# Patient Record
Sex: Male | Born: 1945 | Race: Black or African American | Hispanic: No | Marital: Single
Health system: Southern US, Community
[De-identification: ages and names within clinical notes are randomized; demographics above are authoritative.]

## PROBLEM LIST (undated history)

## (undated) DIAGNOSIS — E119 Type 2 diabetes mellitus without complications: Secondary | ICD-10-CM

## (undated) DIAGNOSIS — I10 Essential (primary) hypertension: Secondary | ICD-10-CM

---

## 2010-06-04 ENCOUNTER — Other Ambulatory Visit (HOSPITAL_COMMUNITY): Payer: Self-pay | Admitting: Chiropractic Medicine

## 2010-06-04 DIAGNOSIS — R29898 Other symptoms and signs involving the musculoskeletal system: Secondary | ICD-10-CM

## 2010-06-07 ENCOUNTER — Ambulatory Visit (HOSPITAL_COMMUNITY)
Admission: RE | Admit: 2010-06-07 | Discharge: 2010-06-07 | Disposition: A | Discharge status: Home / Self Care | Payer: Self-pay | Source: Ambulatory Visit | Attending: Chiropractic Medicine | Admitting: Chiropractic Medicine

## 2010-06-07 DIAGNOSIS — M545 Low back pain, unspecified: Secondary | ICD-10-CM | POA: Insufficient documentation

## 2010-06-07 DIAGNOSIS — R29898 Other symptoms and signs involving the musculoskeletal system: Secondary | ICD-10-CM

## 2010-06-07 DIAGNOSIS — M48061 Spinal stenosis, lumbar region without neurogenic claudication: Secondary | ICD-10-CM | POA: Insufficient documentation

## 2010-06-07 DIAGNOSIS — M6281 Muscle weakness (generalized): Secondary | ICD-10-CM | POA: Insufficient documentation

## 2010-06-07 DIAGNOSIS — M519 Unspecified thoracic, thoracolumbar and lumbosacral intervertebral disc disorder: Secondary | ICD-10-CM | POA: Insufficient documentation

## 2010-10-12 ENCOUNTER — Other Ambulatory Visit (HOSPITAL_COMMUNITY): Payer: Self-pay | Admitting: Chiropractic Medicine

## 2010-10-12 DIAGNOSIS — R413 Other amnesia: Secondary | ICD-10-CM

## 2010-10-13 ENCOUNTER — Ambulatory Visit (HOSPITAL_COMMUNITY)
Admission: RE | Admit: 2010-10-13 | Discharge: 2010-10-13 | Disposition: A | Discharge status: Home / Self Care | Payer: Self-pay | Source: Ambulatory Visit | Attending: Chiropractic Medicine | Admitting: Chiropractic Medicine

## 2010-10-13 DIAGNOSIS — R413 Other amnesia: Secondary | ICD-10-CM | POA: Insufficient documentation

## 2010-10-13 DIAGNOSIS — J3489 Other specified disorders of nose and nasal sinuses: Secondary | ICD-10-CM | POA: Insufficient documentation

## 2017-04-13 ENCOUNTER — Emergency Department (HOSPITAL_COMMUNITY): Payer: No Typology Code available for payment source

## 2017-04-13 ENCOUNTER — Emergency Department (HOSPITAL_COMMUNITY)
Admission: EM | Admit: 2017-04-13 | Discharge: 2017-04-13 | Disposition: A | Discharge status: Home / Self Care | Payer: No Typology Code available for payment source | Attending: Emergency Medicine | Admitting: Emergency Medicine

## 2017-04-13 ENCOUNTER — Encounter (HOSPITAL_COMMUNITY): Payer: Self-pay | Admitting: Emergency Medicine

## 2017-04-13 DIAGNOSIS — R1084 Generalized abdominal pain: Secondary | ICD-10-CM | POA: Insufficient documentation

## 2017-04-13 DIAGNOSIS — Y92410 Unspecified street and highway as the place of occurrence of the external cause: Secondary | ICD-10-CM | POA: Diagnosis not present

## 2017-04-13 DIAGNOSIS — Y9389 Activity, other specified: Secondary | ICD-10-CM | POA: Diagnosis not present

## 2017-04-13 DIAGNOSIS — M542 Cervicalgia: Secondary | ICD-10-CM | POA: Insufficient documentation

## 2017-04-13 DIAGNOSIS — S060X1A Concussion with loss of consciousness of 30 minutes or less, initial encounter: Secondary | ICD-10-CM | POA: Diagnosis not present

## 2017-04-13 DIAGNOSIS — Y999 Unspecified external cause status: Secondary | ICD-10-CM | POA: Insufficient documentation

## 2017-04-13 DIAGNOSIS — S0990XA Unspecified injury of head, initial encounter: Secondary | ICD-10-CM | POA: Diagnosis present

## 2017-04-13 LAB — URINALYSIS, ROUTINE W REFLEX MICROSCOPIC
BILIRUBIN URINE: NEGATIVE
GLUCOSE, UA: NEGATIVE mg/dL
HGB URINE DIPSTICK: NEGATIVE
KETONES UR: NEGATIVE mg/dL
Leukocytes, UA: NEGATIVE
Nitrite: NEGATIVE
PH: 6 (ref 5.0–8.0)
PROTEIN: NEGATIVE mg/dL
Specific Gravity, Urine: 1.012 (ref 1.005–1.030)

## 2017-04-13 LAB — COMPREHENSIVE METABOLIC PANEL
ALK PHOS: 45 U/L (ref 38–126)
ALT: 21 U/L (ref 17–63)
AST: 25 U/L (ref 15–41)
Albumin: 3.7 g/dL (ref 3.5–5.0)
Anion gap: 7 (ref 5–15)
BUN: 14 mg/dL (ref 6–20)
CALCIUM: 9.1 mg/dL (ref 8.9–10.3)
CHLORIDE: 107 mmol/L (ref 101–111)
CO2: 25 mmol/L (ref 22–32)
CREATININE: 1.08 mg/dL (ref 0.61–1.24)
GFR calc non Af Amer: >60 mL/min (ref >60)
GLUCOSE: 104 mg/dL — AB (ref 65–99)
Potassium: 4 mmol/L (ref 3.5–5.1)
SODIUM: 139 mmol/L (ref 135–145)
Total Bilirubin: 0.4 mg/dL (ref 0.3–1.2)
Total Protein: 6.9 g/dL (ref 6.5–8.1)

## 2017-04-13 LAB — CBC
HCT: 40.1 % (ref 39.0–52.0)
Hemoglobin: 12.7 g/dL — ABNORMAL LOW (ref 13.0–17.0)
MCH: 27.3 pg (ref 26.0–34.0)
MCHC: 31.7 g/dL (ref 30.0–36.0)
MCV: 86.1 fL (ref 78.0–100.0)
PLATELETS: 155 10*3/uL (ref 150–400)
RBC: 4.66 MIL/uL (ref 4.22–5.81)
RDW: 13.4 % (ref 11.5–15.5)
WBC: 4.2 10*3/uL (ref 4.0–10.5)

## 2017-04-13 LAB — I-STAT CG4 LACTIC ACID, ED
LACTIC ACID, VENOUS: 1.4 mmol/L (ref 0.5–1.9)
Lactic Acid, Venous: 2.45 mmol/L (ref 0.5–1.9)

## 2017-04-13 LAB — PROTIME-INR
INR: 1.03
PROTHROMBIN TIME: 13.4 s (ref 11.4–15.2)

## 2017-04-13 LAB — SAMPLE TO BLOOD BANK

## 2017-04-13 LAB — ETHANOL

## 2017-04-13 MED ORDER — FENTANYL CITRATE (PF) 100 MCG/2ML IJ SOLN
50.0000 ug | Freq: Once | INTRAMUSCULAR | Status: AC
  Administered 2017-04-13: 50 ug via INTRAVENOUS
  Filled 2017-04-13: qty 2

## 2017-04-13 MED ORDER — IOPAMIDOL (ISOVUE-300) INJECTION 61%
INTRAVENOUS | Status: AC
  Administered 2017-04-13: 100 mL
  Filled 2017-04-13: qty 100

## 2017-04-13 MED ORDER — ONDANSETRON HCL 4 MG/2ML IJ SOLN
4.0000 mg | Freq: Once | INTRAMUSCULAR | Status: AC
  Administered 2017-04-13: 4 mg via INTRAVENOUS
  Filled 2017-04-13: qty 2

## 2017-04-13 MED ORDER — CYCLOBENZAPRINE HCL 5 MG PO TABS: 5.0000 mg | ORAL_TABLET | Freq: Two times a day (BID) | ORAL | 0 refills | Status: AC | PRN

## 2017-04-13 MED ORDER — SODIUM CHLORIDE 0.9 % IV BOLUS (SEPSIS)
1000.0000 mL | Freq: Once | INTRAVENOUS | Status: AC
  Administered 2017-04-13: 1000 mL via INTRAVENOUS

## 2017-04-13 NOTE — ED Notes (Signed)
Pt verbalized understanding discharge instructions and denies any further needs or questions at this time. VS stable, ambulatory and steady gait.   
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
## 2017-04-13 NOTE — ED Notes (Signed)
Patient is resting
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
## 2017-04-13 NOTE — Discharge Instructions (Signed)
Your imaging revealed no evidence of significant traumatic injury.  We suspect he had a concussion.  He will likely feel sore for the next few days, please use the muscle relaxant to help if you are having muscle spasms continuing.  Please follow-up with a primary care physician for reassessment and further management.  If any symptoms change or worsen, please return to the nearest emergency department.

## 2017-04-23 ENCOUNTER — Encounter (HOSPITAL_COMMUNITY): Payer: Self-pay | Admitting: *Deleted

## 2017-04-23 ENCOUNTER — Emergency Department (HOSPITAL_COMMUNITY)
Admission: EM | Admit: 2017-04-23 | Discharge: 2017-04-23 | Disposition: A | Discharge status: Home / Self Care | Payer: No Typology Code available for payment source | Attending: Emergency Medicine | Admitting: Emergency Medicine

## 2017-04-23 ENCOUNTER — Other Ambulatory Visit: Payer: Self-pay

## 2017-04-23 ENCOUNTER — Emergency Department (HOSPITAL_COMMUNITY): Payer: No Typology Code available for payment source

## 2017-04-23 DIAGNOSIS — F0781 Postconcussional syndrome: Secondary | ICD-10-CM

## 2017-04-23 DIAGNOSIS — M791 Myalgia, unspecified site: Secondary | ICD-10-CM | POA: Diagnosis not present

## 2017-04-23 DIAGNOSIS — R109 Unspecified abdominal pain: Secondary | ICD-10-CM | POA: Diagnosis present

## 2017-04-23 LAB — COMPREHENSIVE METABOLIC PANEL
ALBUMIN: 3.8 g/dL (ref 3.5–5.0)
ALK PHOS: 52 U/L (ref 38–126)
ALT: 23 U/L (ref 17–63)
AST: 23 U/L (ref 15–41)
Anion gap: 6 (ref 5–15)
BILIRUBIN TOTAL: 0.5 mg/dL (ref 0.3–1.2)
BUN: 16 mg/dL (ref 6–20)
CALCIUM: 9.1 mg/dL (ref 8.9–10.3)
CO2: 27 mmol/L (ref 22–32)
Chloride: 103 mmol/L (ref 101–111)
Creatinine, Ser: 1.13 mg/dL (ref 0.61–1.24)
GFR calc Af Amer: >60 mL/min (ref >60)
GFR calc non Af Amer: >60 mL/min (ref >60)
GLUCOSE: 93 mg/dL (ref 65–99)
Potassium: 4.2 mmol/L (ref 3.5–5.1)
SODIUM: 136 mmol/L (ref 135–145)
TOTAL PROTEIN: 7.4 g/dL (ref 6.5–8.1)

## 2017-04-23 LAB — CBC
HEMATOCRIT: 40.7 % (ref 39.0–52.0)
HEMOGLOBIN: 13.2 g/dL (ref 13.0–17.0)
MCH: 27.4 pg (ref 26.0–34.0)
MCHC: 32.4 g/dL (ref 30.0–36.0)
MCV: 84.6 fL (ref 78.0–100.0)
Platelets: 188 10*3/uL (ref 150–400)
RBC: 4.81 MIL/uL (ref 4.22–5.81)
RDW: 13 % (ref 11.5–15.5)
WBC: 5.2 10*3/uL (ref 4.0–10.5)

## 2017-04-23 LAB — URINALYSIS, ROUTINE W REFLEX MICROSCOPIC
BILIRUBIN URINE: NEGATIVE
Glucose, UA: NEGATIVE mg/dL
HGB URINE DIPSTICK: NEGATIVE
Ketones, ur: NEGATIVE mg/dL
Leukocytes, UA: NEGATIVE
Nitrite: NEGATIVE
PH: 6 (ref 5.0–8.0)
Protein, ur: NEGATIVE mg/dL
SPECIFIC GRAVITY, URINE: 1.01 (ref 1.005–1.030)

## 2017-04-23 LAB — LIPASE, BLOOD: Lipase: 20 U/L (ref 11–51)

## 2017-04-23 MED ORDER — CYCLOBENZAPRINE HCL 5 MG PO TABS: 5.0000 mg | ORAL_TABLET | Freq: Every evening | ORAL | 0 refills | Status: AC | PRN

## 2017-04-23 MED ORDER — IBUPROFEN 600 MG PO TABS: 600.0000 mg | ORAL_TABLET | Freq: Two times a day (BID) | ORAL | 0 refills | Status: AC

## 2017-04-23 MED ORDER — KETOROLAC TROMETHAMINE 15 MG/ML IJ SOLN
15.0000 mg | Freq: Once | INTRAMUSCULAR | Status: AC
  Administered 2017-04-23: 15 mg via INTRAVENOUS
  Filled 2017-04-23: qty 1

## 2017-04-23 NOTE — ED Triage Notes (Addendum)
Pt reports having a recent cough, left rib pain. Has headache and neck pain. Denies fever. Unable to sleep. Is tearful at triage. Has other complaints such as n/v/d and recent blood in stools.

## 2017-04-23 NOTE — Discharge Instructions (Signed)
Take ibuprofen 2 times a day with meals.  Do not take other anti-inflammatories at the same time open (Advil, Motrin, naproxen, Aleve). You may supplement with Tylenol if you need further pain control. Use ice packs or heating pads to help control your pain. Use Flexeril as needed for bedtime use.  Have caution, as this may make you tired or groggy.  Do not drive or operate heavy machinery while taking this medicine. Follow up with your primary care doctor for evaluation of your blood pressure and pain.
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
## 2020-08-14 ENCOUNTER — Emergency Department (HOSPITAL_COMMUNITY): Payer: No Typology Code available for payment source

## 2020-08-14 ENCOUNTER — Inpatient Hospital Stay (HOSPITAL_COMMUNITY)
Admission: EM | Admit: 2020-08-14 | Discharge: 2020-08-15 | DRG: 069 | Disposition: A | Discharge status: Home / Self Care | Payer: No Typology Code available for payment source | Attending: Internal Medicine | Admitting: Internal Medicine

## 2020-08-14 DIAGNOSIS — M549 Dorsalgia, unspecified: Secondary | ICD-10-CM | POA: Diagnosis present

## 2020-08-14 DIAGNOSIS — I10 Essential (primary) hypertension: Secondary | ICD-10-CM | POA: Diagnosis present

## 2020-08-14 DIAGNOSIS — Z7982 Long term (current) use of aspirin: Secondary | ICD-10-CM

## 2020-08-14 DIAGNOSIS — F0280 Dementia in other diseases classified elsewhere without behavioral disturbance: Secondary | ICD-10-CM | POA: Diagnosis present

## 2020-08-14 DIAGNOSIS — Z79899 Other long term (current) drug therapy: Secondary | ICD-10-CM

## 2020-08-14 DIAGNOSIS — E785 Hyperlipidemia, unspecified: Secondary | ICD-10-CM | POA: Diagnosis present

## 2020-08-14 DIAGNOSIS — C61 Malignant neoplasm of prostate: Secondary | ICD-10-CM | POA: Insufficient documentation

## 2020-08-14 DIAGNOSIS — R42 Dizziness and giddiness: Secondary | ICD-10-CM

## 2020-08-14 DIAGNOSIS — R471 Dysarthria and anarthria: Secondary | ICD-10-CM | POA: Diagnosis present

## 2020-08-14 DIAGNOSIS — F4312 Post-traumatic stress disorder, chronic: Secondary | ICD-10-CM | POA: Insufficient documentation

## 2020-08-14 DIAGNOSIS — G629 Polyneuropathy, unspecified: Secondary | ICD-10-CM | POA: Diagnosis present

## 2020-08-14 DIAGNOSIS — K219 Gastro-esophageal reflux disease without esophagitis: Secondary | ICD-10-CM | POA: Diagnosis present

## 2020-08-14 DIAGNOSIS — Z8673 Personal history of transient ischemic attack (TIA), and cerebral infarction without residual deficits: Secondary | ICD-10-CM | POA: Diagnosis not present

## 2020-08-14 DIAGNOSIS — F4310 Post-traumatic stress disorder, unspecified: Secondary | ICD-10-CM | POA: Diagnosis present

## 2020-08-14 DIAGNOSIS — R7303 Prediabetes: Secondary | ICD-10-CM | POA: Diagnosis present

## 2020-08-14 DIAGNOSIS — Z8546 Personal history of malignant neoplasm of prostate: Secondary | ICD-10-CM | POA: Diagnosis not present

## 2020-08-14 DIAGNOSIS — J302 Other seasonal allergic rhinitis: Secondary | ICD-10-CM | POA: Diagnosis present

## 2020-08-14 DIAGNOSIS — R001 Bradycardia, unspecified: Secondary | ICD-10-CM | POA: Diagnosis present

## 2020-08-14 DIAGNOSIS — G8929 Other chronic pain: Secondary | ICD-10-CM | POA: Diagnosis present

## 2020-08-14 DIAGNOSIS — G459 Transient cerebral ischemic attack, unspecified: Principal | ICD-10-CM | POA: Diagnosis present

## 2020-08-14 DIAGNOSIS — N400 Enlarged prostate without lower urinary tract symptoms: Secondary | ICD-10-CM | POA: Diagnosis present

## 2020-08-14 DIAGNOSIS — Z20822 Contact with and (suspected) exposure to covid-19: Secondary | ICD-10-CM | POA: Diagnosis present

## 2020-08-14 DIAGNOSIS — F329 Major depressive disorder, single episode, unspecified: Secondary | ICD-10-CM | POA: Diagnosis present

## 2020-08-14 LAB — COMPREHENSIVE METABOLIC PANEL
ALT: 24 U/L (ref 0–44)
AST: 24 U/L (ref 15–41)
Albumin: 4 g/dL (ref 3.5–5.0)
Alkaline Phosphatase: 45 U/L (ref 38–126)
Anion gap: 5 (ref 5–15)
BUN: 15 mg/dL (ref 8–23)
CO2: 29 mmol/L (ref 22–32)
Calcium: 9.2 mg/dL (ref 8.9–10.3)
Chloride: 104 mmol/L (ref 98–111)
Creatinine, Ser: 1.19 mg/dL (ref 0.61–1.24)
GFR, Estimated: >60 mL/min (ref >60)
Glucose, Bld: 128 mg/dL — ABNORMAL HIGH (ref 70–99)
Potassium: 4.4 mmol/L (ref 3.5–5.1)
Sodium: 138 mmol/L (ref 135–145)
Total Bilirubin: 0.9 mg/dL (ref 0.3–1.2)
Total Protein: 7.6 g/dL (ref 6.5–8.1)

## 2020-08-14 LAB — DIFFERENTIAL
Abs Immature Granulocytes: 0.03 10*3/uL (ref 0.00–0.07)
Basophils Absolute: 0 10*3/uL (ref 0.0–0.1)
Basophils Relative: 0 %
Eosinophils Absolute: 0.1 10*3/uL (ref 0.0–0.5)
Eosinophils Relative: 1 %
Immature Granulocytes: 0 %
Lymphocytes Relative: 14 %
Lymphs Abs: 1.1 10*3/uL (ref 0.7–4.0)
Monocytes Absolute: 0.4 10*3/uL (ref 0.1–1.0)
Monocytes Relative: 5 %
Neutro Abs: 6.3 10*3/uL (ref 1.7–7.7)
Neutrophils Relative %: 80 %

## 2020-08-14 LAB — CBC
HCT: 45.2 % (ref 39.0–52.0)
Hemoglobin: 14.3 g/dL (ref 13.0–17.0)
MCH: 27.6 pg (ref 26.0–34.0)
MCHC: 31.6 g/dL (ref 30.0–36.0)
MCV: 87.1 fL (ref 80.0–100.0)
Platelets: 189 10*3/uL (ref 150–400)
RBC: 5.19 MIL/uL (ref 4.22–5.81)
RDW: 12.9 % (ref 11.5–15.5)
WBC: 7.9 10*3/uL (ref 4.0–10.5)
nRBC: 0 % (ref 0.0–0.2)

## 2020-08-14 LAB — PROTIME-INR
INR: 1.1 (ref 0.8–1.2)
Prothrombin Time: 13.7 seconds (ref 11.4–15.2)

## 2020-08-14 LAB — CBG MONITORING, ED
Glucose-Capillary: 96 mg/dL (ref 70–99)
Glucose-Capillary: 104 mg/dL — ABNORMAL HIGH (ref 70–99)

## 2020-08-14 LAB — RESP PANEL BY RT-PCR (FLU A&B, COVID) ARPGX2
Influenza A by PCR: NEGATIVE
Influenza B by PCR: NEGATIVE
SARS Coronavirus 2 by RT PCR: NEGATIVE

## 2020-08-14 LAB — I-STAT CHEM 8, ED
BUN: 18 mg/dL (ref 8–23)
Calcium, Ion: 1.23 mmol/L (ref 1.15–1.40)
Chloride: 102 mmol/L (ref 98–111)
Creatinine, Ser: 1.2 mg/dL (ref 0.61–1.24)
Glucose, Bld: 121 mg/dL — ABNORMAL HIGH (ref 70–99)
HCT: 46 % (ref 39.0–52.0)
Hemoglobin: 15.6 g/dL (ref 13.0–17.0)
Potassium: 4.7 mmol/L (ref 3.5–5.1)
Sodium: 141 mmol/L (ref 135–145)
TCO2: 29 mmol/L (ref 22–32)

## 2020-08-14 LAB — APTT: aPTT: 27 seconds (ref 24–36)

## 2020-08-14 LAB — ETHANOL: Alcohol, Ethyl (B): <10 mg/dL (ref <10)

## 2020-08-14 LAB — VITAMIN B12: Vitamin B-12: 201 pg/mL (ref 180–914)

## 2020-08-14 LAB — LACTIC ACID, PLASMA: Lactic Acid, Venous: 1.9 mmol/L (ref 0.5–1.9)

## 2020-08-14 LAB — TSH: TSH: 0.894 u[IU]/mL (ref 0.350–4.500)

## 2020-08-14 MED ORDER — CLOPIDOGREL BISULFATE 75 MG PO TABS
75.0000 mg | ORAL_TABLET | Freq: Every day | ORAL | Status: DC
  Administered 2020-08-14 – 2020-08-15 (×2): 75 mg via ORAL
  Filled 2020-08-14 (×2): qty 1

## 2020-08-14 MED ORDER — CITALOPRAM HYDROBROMIDE 10 MG PO TABS
20.0000 mg | ORAL_TABLET | Freq: Every day | ORAL | Status: DC
  Administered 2020-08-15: 20 mg via ORAL
  Filled 2020-08-14 (×2): qty 2

## 2020-08-14 MED ORDER — ACETAMINOPHEN 650 MG RE SUPP: 650.0000 mg | Freq: Four times a day (QID) | RECTAL | Status: DC | PRN

## 2020-08-14 MED ORDER — GABAPENTIN 300 MG PO CAPS
300.0000 mg | ORAL_CAPSULE | Freq: Every morning | ORAL | Status: DC
  Administered 2020-08-15: 300 mg via ORAL
  Filled 2020-08-14: qty 1

## 2020-08-14 MED ORDER — DIVALPROEX SODIUM 250 MG PO DR TAB
1000.0000 mg | DELAYED_RELEASE_TABLET | Freq: Every day | ORAL | Status: DC
  Administered 2020-08-15: 1000 mg via ORAL
  Filled 2020-08-14: qty 4

## 2020-08-14 MED ORDER — TRAZODONE HCL 50 MG PO TABS: 25.0000 mg | ORAL_TABLET | Freq: Every evening | ORAL | Status: DC | PRN

## 2020-08-14 MED ORDER — HYDROCODONE-ACETAMINOPHEN 5-325 MG PO TABS: 1.0000 | ORAL_TABLET | Freq: Once | ORAL | Status: DC | PRN

## 2020-08-14 MED ORDER — FLUTICASONE PROPIONATE 50 MCG/ACT NA SUSP
1.0000 | Freq: Every day | NASAL | Status: DC
  Filled 2020-08-14: qty 16

## 2020-08-14 MED ORDER — POLYETHYLENE GLYCOL 3350 17 G PO PACK: 17.0000 g | PACK | Freq: Every day | ORAL | Status: DC | PRN

## 2020-08-14 MED ORDER — ASPIRIN EC 81 MG PO TBEC
81.0000 mg | DELAYED_RELEASE_TABLET | Freq: Every day | ORAL | Status: DC
  Administered 2020-08-14 – 2020-08-15 (×2): 81 mg via ORAL
  Filled 2020-08-14 (×2): qty 1

## 2020-08-14 MED ORDER — SIMVASTATIN 20 MG PO TABS: 40.0000 mg | ORAL_TABLET | Freq: Every day | ORAL | Status: DC

## 2020-08-14 MED ORDER — ENOXAPARIN SODIUM 40 MG/0.4ML ~~LOC~~ SOLN
40.0000 mg | SUBCUTANEOUS | Status: DC
  Administered 2020-08-14: 40 mg via SUBCUTANEOUS
  Filled 2020-08-14: qty 0.4

## 2020-08-14 MED ORDER — GABAPENTIN 600 MG PO TABS
600.0000 mg | ORAL_TABLET | Freq: Every day | ORAL | Status: DC
  Administered 2020-08-14: 600 mg via ORAL
  Filled 2020-08-14: qty 1

## 2020-08-14 MED ORDER — IOHEXOL 350 MG/ML SOLN
75.0000 mL | Freq: Once | INTRAVENOUS | Status: AC | PRN
  Administered 2020-08-14: 75 mL via INTRAVENOUS

## 2020-08-14 MED ORDER — ACETAMINOPHEN 325 MG PO TABS: 650.0000 mg | ORAL_TABLET | Freq: Four times a day (QID) | ORAL | Status: DC | PRN

## 2020-08-14 NOTE — H&P (Signed)
Date: 08/14/2020               Patient Name:  Jacob [name] MRN: [id number]  DOB: Dec 07, 1945 Age / Sex: 75 y.o., male   [facility]         Medical Service: Internal Medicine Teaching Service         Attending Physician: Dr. Dorian Pod    First Contact: Dr. Alexandria Lodge Pager: [phone]  Second Contact: Dr. Mitzi Hansen Pager: [phone]       After Hours (After 5p/  First Contact Pager: [phone]  weekends / holidays): Second Contact Pager: [phone]   Chief Complaint: dizziness  History of Present Illness:   Jacob [name] is a 75 y.o. man with past medical history of HTN, HLD, pre-diabetes, BPH, prostate cancer, GERD, degenerative disc disease, spinal stenosis, chronic back pain, peripheral neuropathy, history of exposure to Agent Orange, PTSD, major depression, possible dementia (patient on Aricept on [place] record) presenting to the [facility] ED with acute onset dizziness and treated as a code stroke on arrival.  Patient reports he was in his usual state of health until around 12pm today when he was in [place] sitting in a car trying to get it to start and experienced acute onset dizziness. States he felt as if his head tipped back as the feeling came on. He told his friend who he was with that he didn't feel right, and when the friend told him to get into his car, he reports he was so dizzy he had to crawl his way into it. They stopped for food on the way and he vomited when he tried to eat. By the time he arrived to the ED was feeling significantly less dizzy.  Patient reports he has experienced intermittent slight dizziness "for a while," "but nothing like today." States the dizziness he experiences does not occur with position changes. He "takes his time" and the episodes resolve. States he wears glasses on-and-off and two months ago noticed his vision became dim, however last week everything became brighter again. Denies headache, blurry vision, congestion,  sore throat, chest pain, shortness of breath, palpitations, abdominal pain, dysuria, hematuria, weakness, numbness, tingling.  Reports he gets most of his medical care from [facility]. He is unable to list most of his medical problems or medications, however reports adherence to his medications.    ED Course: Afebrile, BP 160s/70s-80s, P 50s-60s, SpO2 >94% on room air. CBC with WBC 7.9, Hgb 14.3, Plts 189. CMP with Na 138, K 4.4, glucose 128, creatinine 1.19 (baseline ~1.1). TSH 0.894. Vit B12 201. Ethanol <10. PT/INR 13.7/1.1. CT head wo contrast without evidence of acute intracranial abnormality. CT angio neck code stroke significant for patent common carotid and internal carotid arteries with mild atherosclerotic disease, suspected moderate/severe stenosis at the origin of the R vertebral artery, nonspecific 16 mm cystic appearing cutaneous/subQ lesion within the posterior left upper neck, markedly irregular and stenotic appearance of the V4 vertebral arteries bilaterally and of the proximal basilar artery with possible partial occlusion of these vessels. EKG limited quality, bradycardia. IMTS consulted for admission for TIA workup.   Meds:   [facility]: Aspirin 81 mg daily Citalopram 40 mg daily Trazodone 25 mg nightly as needed Divalproex 1000 mg once daily Donepezil 23 mg daily Flonase Gabapentin 300 mg AM, 600 mg PM Hydrocodone-acetaminophen 5-325 mg once daily PRN Lisinopril 5 mg daily Prilosec Simvastatin 40 mg daily Terazosin 2 mg nightly  Social History:  Lives in [place], [place]. 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 Denies current or previous cigarette smoking, alcohol use, marijuana use, or other drugs. Vaccinated against COVID x 3.  Family History: Denies family history of stroke, heart attack, cancer, diabetes, hypertension. Mother and father died from old age.   Allergies: Allergies as of 08/14/2020  . (No Known Allergies)    Review of  Systems: A complete ROS was negative except as per HPI.   Physical Exam: Blood pressure (!) 167/80, pulse (!) 50, resp. rate 18, SpO2 100 %. Constitutional: well-appearing man lying in ED stretcher, in no acute distress HENT: normocephalic atraumatic, mucous membranes moist Eyes: conjunctiva non-erythematous Neck: supple Cardiovascular: bradycardia, regular rhythm, no m/r/g, trace bilateral lower extremity edema to the mid shins; 1+ DP pulses Pulmonary/Chest: normal work of breathing on room air, lungs clear to auscultation bilaterally, no wheezes, rales, or rhonchi Abdominal: soft, non-tender, non-distended MSK: normal bulk and tone Neurological: alert & oriented to person, place, and time; CN II-XII intact, 5/5 strength in bilateral upper and lower extremities, sensation intact to light touch throughout. No dysmetria with finger-to-nose or heel-to-shin. No pronator drift. Skin: warm and dry; numerous scars across chest which patient states are from [place] Psych: normal mood and affect  Labs: CBC    Component Value Date/Time   WBC 7.9 08/14/2020 1521   RBC 5.19 08/14/2020 1521   HGB 15.6 08/14/2020 1534   HCT 46.0 08/14/2020 1534   PLT 189 08/14/2020 1521   MCV 87.1 08/14/2020 1521   MCH 27.6 08/14/2020 1521   MCHC 31.6 08/14/2020 1521   RDW 12.9 08/14/2020 1521   LYMPHSABS 1.1 08/14/2020 1521   MONOABS 0.4 08/14/2020 1521   EOSABS 0.1 08/14/2020 1521   BASOSABS 0.0 08/14/2020 1521     CMP     Component Value Date/Time   NA 141 08/14/2020 1534   K 4.7 08/14/2020 1534   CL 102 08/14/2020 1534   CO2 29 08/14/2020 1521   GLUCOSE 121 (H) 08/14/2020 1534   BUN 18 08/14/2020 1534   CREATININE 1.20 08/14/2020 1534   CALCIUM 9.2 08/14/2020 1521   PROT 7.6 08/14/2020 1521   ALBUMIN 4.0 08/14/2020 1521   AST 24 08/14/2020 1521   ALT 24 08/14/2020 1521   ALKPHOS 45 08/14/2020 1521   BILITOT 0.9 08/14/2020 1521   GFRNONAA >60 08/14/2020 1521   GFRAA >60 04/23/2017 1023     Imaging: CT HEAD CODE STROKE WO CONTRAST  Result Date: 08/14/2020 CLINICAL DATA:  Code stroke. Neuro deficit, acute, stroke suspected; dizziness, visual disturbance. Additional provided vision, weakness. EXAM: CT HEAD WITHOUT CONTRAST TECHNIQUE: Contiguous axial images were obtained from the base of the skull through the vertex without intravenous contrast. COMPARISON:  Noncontrast head CT 04/13/2017.  Brain MRI 10/13/2010. FINDINGS: Brain: Cerebral volume is normal. There is no acute intracranial hemorrhage. No demarcated cortical infarct. No extra-axial fluid collection. No evidence of intracranial mass. No midline shift. Vascular: No hyperdense vessel.  Atherosclerotic calcifications. Skull: Normal. Negative for fracture or focal lesion. Sinuses/Orbits: Visualized orbits show no acute finding. Small bilateral maxillary sinus mucous retention cysts at the imaged levels. ASPECTS [facility] Stroke Program Early CT Score) - Ganglionic level infarction (caudate, lentiform nuclei, internal capsule, insula, M1-M3 cortex): 7 - Supraganglionic infarction (M4-M6 cortex): 3 Total score (0-10 with 10 being normal): 10 These results were communicated to Dr. Leonel Ramsay at 3:44 pmon 4/15/2022by text page via the [facility] messaging system. IMPRESSION: No evidence of acute intracranial  abnormality. ASPECTS is 10. Electronically Signed   By: Kellie Simmering DO   On: 08/14/2020 15:44    EKG: personally reviewed. My interpretation is bradycardia, limited quality.  Assessment & Plan by Problem: Principal Problem:   Transient ischemic attack (TIA) Active Problems:   Essential hypertension   Hyperlipidemia LDL goal <70  Jacob [name] is a 75 y.o. man with past medical history of HTN, HLD, pre-diabetes, BPH, prostate cancer, GERD, degenerative disc disease, spinal stenosis, chronic back pain, peripheral neuropathy, history of exposure to Agent Orange, PTSD, major depression, possible dementia (patient on Aricept on [place]  record) presenting to the [facility] ED with acute onset dizziness, treated as a code stroke on arrival, and admitted for TIA work-up and risk factor modification.  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 CTH negative for acute infarct. CTA head and neck with moderate to severe stenosis of R vertebral artery and irregular and stenotic appearance of V4 bilaterally. Additionally stenosis to the proximal basilar artery with possible partial occlusion. No tPA was given because of mild symptoms which had resolved and NIHSS of zero. On my evaluation, vitals significant for BP 160s/80s, P 50s-60s. Patient endorsing mild residual dizziness but no focal deficits on exam. Neurology to obtain MRI brain and MRA head and neck given the streak artifact and uncertain degree of basilar stenosis vs partial occlusion. Given the acute, spontaneous, and persistent nature of the patient's dizziness, differential includes vestibular neuronitis however would not expect such quick resolution of symptoms. ABCD2 score of 4 (age >60, BP >140/90, duration >60 min). Risk factors include HTN, HLD, pre-diabetes. TIA work-up and risk factor modification as below.  - Neurology stroke team following, appreciate their expertise - MRI brain without contrast - MRA head and neck after MRI if needed - TTE - Hgb A1c, lipid panel, TSH - Urinalysis, urine culture,  - Patient on simvastatin 40 mg daily; recommend increased dose if LDL >70 - Continue aspirin 81 mg daily for life - Start clopidogrel 75 mg daily for life - Permissive hypertension for first 24 h <220/110. Hold home lisinopril 5 mg daily. - Cardiac telemetry - NPO until passes bedside swallow - PT/OT/SLP consult  PTSD Depression ?Dementia or MCI -  continue home citalopram 20 mg daily - continue home trazodone 25 mg nightly as needed - continue home Divalproex 1000 mg once daily - Donepezil 23 mg daily is on the patient's med list, however does not have a fill history so will hold for now  BPH History of prostate cancer - Hold terazosin 2 mg nightly in setting of permissive hypertension  Chronic back pain Peripheral neuropathy On chart review, patient with history of DDD, spinal stenosis. - Continue home hydrocodone-acetaminophen 5-325 mg once daily PRN - Continue home Gabapentin 300 mg AM, 600 mg PM  Seasonal allergies Continue home flonase   Diet: Heart Healthy once passes swallow screen VTE: Enoxaparin IVF: None Code: Full  Prior to Admission Living Arrangement: Home Anticipated Discharge Location: Home Barriers to Discharge: further work-up and evaluation  Dispo: Admit patient to Inpatient with expected length of stay greater than 2 midnights.  Signed: Alexandria Lodge, MD Internal Medicine Resident, PGY-1 [facility] Pager: [phone] 4:28 PM, 08/14/2020

## 2020-08-14 NOTE — ED Notes (Signed)
Returned from MRI 

## 2020-08-14 NOTE — ED Provider Notes (Signed)
Patient placed in Quick Look pathway, seen and evaluated   Chief Complaint: dizzy, blurred vision, balance off  HPI:   Pt reports he  Feels like he is drunk. (pt reports he does not drink) Pt reports vision is blurry.  Pt reports he is having difficulty walking.  Pt reports symptoms began today.  Pt reports he felt bad yesterday as well   ROS: no fever, no chills no cough  Physical Exam:   Gen: No distress  Neuro: Awake and Alert  Skin: Warm    Focused Exam: wdwn . Lungs normal resp, Heart bradycardic at 50   Neuro alert, no facial droop   Initiation of care has begun. The patient has been counseled on the process, plan, and necessity for staying for the completion/evaluation, and the remainder of the medical screening examination  Pt within stoke window.     Sidney Ace 08/14/20 1526    Arnaldo Natal, MD 08/14/20 [phone]

## 2020-08-14 NOTE — Code Documentation (Signed)
Stroke Response Nurse Documentation Code Documentation  Jacob [name] is a 75 y.o. male arriving to [place] ED via Private Vehicle on 08/14/2020 with past medical hx of HTN, Prediabetes, and unknown per patient. Code stroke was activated by ED. Patient from home where he was LKW [place] 1200 [place]. He had a sudden onset of dizziness and trouble walking. The friend working with him brought him to the ED.   Stroke team at the bedside on patient activation. Labs drawn and patient cleared for CT by [facility]. Patient to CT with team. NIHSS 0, see documentation for details and code stroke times. Reports dizziness is getting better and is only mildly still present. The following imaging was completed:  CT and CTA Head and Neck. Patient is not a candidate for tPA due to too mild to treat. Care/Plan: q30 mNIHSS/VS until 1630. q2 mNIHSS/VS after this. Bedside handoff with ED RN Luiz Iron.    Patient was walked in the room and report gait was much better than noon. HE was steady on his feet for most of the walk and had slight wobble upon sitting down  Mohammed Kindle S  Stroke Response RN

## 2020-08-14 NOTE — ED Provider Notes (Signed)
[facility] EMERGENCY DEPARTMENT Provider Note   CSN: [id number] Arrival date & time: 08/14/20  1350  An emergency department physician performed an initial assessment on this suspected stroke patient at 1522.  History Chief Complaint  Patient presents with  . Dizziness   HPI Patient is a 75 year old male with a relatively minimal past medical history coming in with a chief complaint of lightheadedness, dizziness earlier today.  Patient is mostly treated at the [facility] for PTSD but is otherwise has minimal medical history.  He does endorse a distant history of atrial fibrillation and states that he is on a blood thinner but does not know which one.  Patient states that 3 hours prior to arrival he had sudden onset lightheadedness and dizziness that was nonpositional in nature.  This described spontaneous episodic vertigo. Patient denies fevers or chills, nausea vomiting, syncope or shortness of breath.  Patient otherwise healthy and active.  No past medical history on file.  There are no problems to display for this patient.   No past surgical history on file.     No family history on file.  Social History   Tobacco Use  . Smoking status: Unknown If Ever Smoked  Substance Use Topics  . Alcohol use: No  . Drug use: No    Home Medications Prior to Admission medications   Medication Sig Start Date End Date Taking? Authorizing Provider  cyclobenzaprine (FLEXERIL) 5 MG tablet Take 1 tablet (5 mg total) by mouth 2 (two) times daily as needed for muscle spasms. 04/13/17   Tegeler, Gwenyth Allegra, MD  cyclobenzaprine (FLEXERIL) 5 MG tablet Take 1 tablet (5 mg total) by mouth at bedtime as needed for muscle spasms. 04/23/17   Caccavale, Sophia, PA-C  ibuprofen (ADVIL,MOTRIN) 600 MG tablet Take 1 tablet (600 mg total) by mouth 2 (two) times daily with a meal. 04/23/17   Caccavale, Sophia, PA-C    Allergies    Patient has no known allergies.  Review of Systems    Review of Systems  Constitutional: Negative for chills and fever.  HENT: Negative for ear pain and sore throat.   Eyes: Negative for pain and visual disturbance.  Respiratory: Negative for cough and shortness of breath.   Cardiovascular: Negative for chest pain and palpitations.  Gastrointestinal: Negative for abdominal pain and vomiting.  Genitourinary: Negative for dysuria and hematuria.  Musculoskeletal: Negative for arthralgias and back pain.  Skin: Negative for color change and rash.  Neurological: Positive for dizziness and light-headedness. Negative for seizures and syncope.  All other systems reviewed and are negative.   Physical Exam Updated Vital Signs BP (!) 167/80 (BP Location: Right Leg)   Pulse (!) 50   Resp 18   SpO2 100%   Physical Exam Vitals and nursing note reviewed.  Constitutional:      Appearance: He is well-developed.  HENT:     Head: Normocephalic and atraumatic.     Nose: No congestion or rhinorrhea.     Mouth/Throat:     Mouth: Mucous membranes are moist.     Pharynx: Oropharynx is clear. No oropharyngeal exudate.  Eyes:     Conjunctiva/sclera: Conjunctivae normal.     Pupils: Pupils are equal, round, and reactive to light.  Cardiovascular:     Rate and Rhythm: Normal rate and regular rhythm.     Heart sounds: No murmur heard.   Pulmonary:     Effort: Pulmonary effort is normal. No respiratory distress.     Breath sounds: Normal  breath sounds.  Abdominal:     Palpations: Abdomen is soft.     Tenderness: There is no abdominal tenderness.  Musculoskeletal:        General: No swelling, tenderness, deformity or signs of injury. Normal range of motion.     Cervical back: Neck supple. No rigidity or tenderness.  Skin:    General: Skin is warm and dry.  Neurological:     General: No focal deficit present.     Mental Status: He is alert and oriented to person, place, and time. Mental status is at baseline.     Cranial Nerves: No cranial nerve  deficit.     Motor: No weakness.     ED Results / Procedures / Treatments   Labs (all labs ordered are listed, but only abnormal results are displayed) Labs Reviewed  COMPREHENSIVE METABOLIC PANEL - Abnormal; Notable for the following components:      Result Value   Glucose, Bld 128 (*)    All other components within normal limits  I-STAT CHEM 8, ED - Abnormal; Notable for the following components:   Glucose, Bld 121 (*)    All other components within normal limits  CBG MONITORING, ED - Abnormal; Notable for the following components:   Glucose-Capillary 104 (*)    All other components within normal limits  RESP PANEL BY RT-PCR (FLU A&B, COVID) ARPGX2  SARS CORONAVIRUS 2 (TAT 6-24 HRS)  ETHANOL  PROTIME-INR  APTT  CBC  DIFFERENTIAL  RAPID URINE DRUG SCREEN, HOSP PERFORMED  URINALYSIS, ROUTINE W REFLEX MICROSCOPIC  TSH  VITAMIN B12  HEMOGLOBIN A1C  CBG MONITORING, ED    EKG None  Radiology CT HEAD CODE STROKE WO CONTRAST  Result Date: 08/14/2020 CLINICAL DATA:  Code stroke. Neuro deficit, acute, stroke suspected; dizziness, visual disturbance. Additional provided vision, weakness. EXAM: CT HEAD WITHOUT CONTRAST TECHNIQUE: Contiguous axial images were obtained from the base of the skull through the vertex without intravenous contrast. COMPARISON:  Noncontrast head CT 04/13/2017.  Brain MRI 10/13/2010. FINDINGS: Brain: Cerebral volume is normal. There is no acute intracranial hemorrhage. No demarcated cortical infarct. No extra-axial fluid collection. No evidence of intracranial mass. No midline shift. Vascular: No hyperdense vessel.  Atherosclerotic calcifications. Skull: Normal. Negative for fracture or focal lesion. Sinuses/Orbits: Visualized orbits show no acute finding. Small bilateral maxillary sinus mucous retention cysts at the imaged levels. ASPECTS [facility] Stroke Program Early CT Score) - Ganglionic level infarction (caudate, lentiform nuclei, internal capsule, insula,  M1-M3 cortex): 7 - Supraganglionic infarction (M4-M6 cortex): 3 Total score (0-10 with 10 being normal): 10 These results were communicated to Dr. Leonel Ramsay at 3:44 pmon 4/15/2022by text page via the [facility] messaging system. IMPRESSION: No evidence of acute intracranial abnormality. ASPECTS is 10. Electronically Signed   By: Kellie Simmering DO   On: 08/14/2020 15:44    Procedures Procedures   Medications Ordered in ED Medications  iohexol (OMNIPAQUE) 350 MG/ML injection 75 mL (75 mLs Intravenous Contrast Given 08/14/20 1617)    ED Course  I have reviewed the triage vital signs and the nursing notes.  Pertinent labs & imaging results that were available during my care of the patient were reviewed by me and considered in my medical decision making (see chart for details).    MDM Rules/Calculators/A&P                          Patient is a 75 year old male presenting with 3 hours of dizziness now  resolving.  Patient treated as a code stroke on arrival to the emergency department given the sudden onset of symptoms.  Patient describing no triggers leading to a past classification of spontaneous episodic vertigo.  This would be most consistent with possible CVA episode.  Neurology saw patient in emergency department and proceeded with imaging, diagnosis of TIA was made given the improvement of symptoms at this time.  Neurology made recommendations for MRI imaging and admission to medicine for continued evaluation and work-up.  Medicine communicated with and accepted patient admission.  Patient admitted at this time. Final Clinical Impression(s) / ED Diagnoses Final diagnoses:  Dizziness    Rx / DC Orders ED Discharge Orders    None       Tretha Sciara, MD 08/14/20 1629    Pattricia Boss, MD 08/14/20 1706

## 2020-08-14 NOTE — ED Notes (Signed)
Call Laurey Arrow (friend) once discharge. # placed in chart under additional tab

## 2020-08-14 NOTE — ED Triage Notes (Signed)
Pt states approx 3 hours ago he started to feel like the room was spinning. Pt also states blurry vision bilaterally. No facial droop, arm drift.

## 2020-08-14 NOTE — Consult Note (Signed)
Neurology consult   CC: code stroke  History is obtained from: patient is a poor historian, but able to give some history, chart.   HPI: Jacob [name] is a 75 yo [place] male with a PMHx of BPH, DM II, prostate cancer, PTSD, major depression, HTN, GERD, HLD, DDD, spinal stenosis, peripheral neuropathy, and likely dementia (patient on Aricept on [place] record) who presents as a code stroke per ED triage PA-C. Patient came in with dizziness and ambulation difficulty and when seen in triage, code stroke was called. Patient was taken emergently to CT suite and had a CTH and CTA head and neck. His symptoms of dizziness completely resolved in the CT suite.   Per patient, he had been up today doing some tasks when he had acute onset of dizziness (feeling like room was spinning) and inability to walk because he knew he would fall. He admits to having a few spells of the same yesterday and he called the [place] for advice. Today, he admits to blurry vision at first, but denies now. Had no weakness of extremity, numbness or tingling, dysphagia, dysarthria, or aphasia. No history of stroke per [place] records.   In CT suite after CTH, he states his symptoms are down to 20% (as compared to 100%) and after CTH, while trying to get an IV, his symptoms completely resolved. However, he was lying down at the time. No tPA was given due to mild symptoms with resolved symptoms and NIHSS of zero. No IR due to no LVO.   In review of chart, patient receives majority of his medical care at the [place]. The notes are somewhat difficult to discern due to their charting system. 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 There is mention of a valproic acid level on 07/23/20 OV which was < 3. NP assumes patient is on this due to mood disorder, but it is not listed on his medicine list. He receives psychotherapy there.   LKW: 1200  hrs.  tpa given?: No  IR Thrombectomy? No MRS 2  NIHSS:  1a Level of Conscious: 0 1b LOC Questions: 0 1c LOC Commands: 0 2 Best Gaze: 0 3 Visual: 0 4 Facial Palsy: 0 5a Motor Arm - left: 0 5b Motor Arm - Right: 0 6a Motor Leg - Left: 0 6b Motor Leg - Right: 0 7 Limb Ataxia: 0 8 Sensory: 0 9 Best Language: 0 10 Dysarthria: 0 11 Extinction and Inattention: 0 TOTAL:    ROS: A robust ROS was performed and is negative except as noted in the HPI.   [facility]: HTN, HLD, prostate cancer, BPH, PTSD, depression, dizziness and giddiness, GERD, peripheral neuropathy.   No family history on file.  Social History:  reports that he does not drink alcohol and does not use drugs. No history on file for tobacco use.   Prior to Admission medications   Medication Sig Start Date End Date Taking? Authorizing Provider  cyclobenzaprine (FLEXERIL) 5 MG tablet Take 1 tablet (5 mg total) by mouth 2 (two) times daily as needed for muscle spasms. 04/13/17   Tegeler, Gwenyth Allegra, MD  cyclobenzaprine (FLEXERIL) 5 MG tablet Take 1 tablet (5 mg total) by mouth at bedtime as needed for muscle spasms. 04/23/17   Caccavale, Sophia, PA-C  ibuprofen (ADVIL,MOTRIN) 600 MG tablet Take 1 tablet (600 mg total) by mouth 2 (two) times daily with a meal. 04/23/17  Caccavale, Sophia, PA-C  Per [place] chart: Aricept, Elavil, Citalopram, B12, Calcium, Lisinopril, MgOx, MVI, simvastatin, meloxicam. ? Valproic acid.   Exam: Current vital signs: BP (!) 169/84   Pulse (!) 58   Resp 19   SpO2 94%   Physical Exam  Constitutional: Appears well-developed and well-nourished.  Psych: Affect appropriate to situation. Poor historian.  Eyes: No scleral injection HENT: No OP obstrucion Head: Normocephalic.  Cardiovascular: Normal rate and regular rhythm.  Respiratory: Effort normal  GI: Soft.  No distension. There is no tenderness.  Skin: WDI  Neuro: Mental Status: Patient is awake, alert, oriented to person, place,  month, year, and situation. Patient is unable to give a clear and coherent history. No signs of aphasia or neglect Speech/Language: clear, fluent. Naming, repetition, and comprehension intact but possibly somewhat delayed.  Cranial Nerves: II: Visual Fields are full. Pupils are equal, round, and reactive to light.  III,IV, VI: EOMI without ptosis or diploplia.  V: Facial sensation is symmetric to temperature VII: Facial movement is symmetric.  VIII: hearing is intact to voice X: Uvula elevates symmetrically XI: Shoulder shrug is symmetric. XII: tongue is midline without atrophy or fasciculations.  Motor: Tone is normal. Bulk is normal. 5/5 strength was present in all four extremities.  Sensory: Sensation is symmetric to light touch in the arms and legs.  Plantars: Toes are downgoing bilaterally.  Cerebellar: FNF and HKS are intact bilaterally.    I have reviewed labs in epic and the pertinent results are: INR  1.1    APTT 27  MD reviewed the images obtained:  NCT head  No evidence of acute intracranial abnormality. ASPECTS is 10.  CTA neck: 1. Common carotid and internal carotid arteries are patent within the neck without hemodynamically significant stenosis. Mild atherosclerotic disease within both carotid systems as described. 2. The vertebral arteries are developmentally diminutive, but patent within the neck. Suspected moderate/severe stenosis at the origin of the right vertebral artery. Streak artifact from a dense left-sided contrast bolus precludes evaluation of the origin of the left vertebral artery. 3. Nonspecific 16 mm cystic appearing cutaneous/subcutaneous lesion within the posterior left upper neck. Direct visualization is recommended.  CTA head: 1. Markedly irregular and stenotic appearance of the V4 vertebral arteries bilaterally, and of the proximal basilar artery with possible partial occlusion of these vessels (the basilar artery  is developmentally diminutive and there are large bilateral posterior communicating arteries). 2. Additional intracranial atherosclerotic disease as described without large vessel occlusion or proximal high-grade arterial stenosis identified elsewhere.  Assessment: 75 yo male with a PMHx as mentioned above who presented to ED with dizziness and difficulty ambulating. After being seen in triage, a code stroke was called. His CTH was negative for acute infarct with an ASPECTS of 10. CTA head and neck showed moderate to severe stenosis of RVA and irregular and stenotic appearance of V4 bilaterally. Also, stenosis to proximal basilar artery with possible partial occlusion. Streak artifact noted and may be interfering with extent of stenosis. CTA head shows possible basilar artery partial occlusion. No tPA given due to mild symptoms and NIHSS of 0. No IR needed due to no LVO.   Given streak artifact and questionable degree of basilar stenosis vs partial occlusion, we will obtain MRI brain, MRA head and neck. If negative, patient may benefit from angiogram.    Patient's stroke risk factors include HTN, DM II, and HLD. Doubt vestibular neuronitis due to quickly resolved symptoms. He will be admitted for TIA workup.  Impression: 1. Dizziness with gait disturbance 2. TIA.  3. 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   Plan: all have been ordered.  - Medicine admit.  - MRI brain without contrast. - MRA head -to better evaluate intracranial stenosis with streak artifact on CTA.   - possible angiogram - Recommend TTE. - Recommend labs: HbA1c, lipid panel, TSH. - Recommend Statin or increased dose f LDL > 70 - Aspirin 81mg  daily for life.  - Clopidogrel 75mg  daily for life.  - SBP goal - Permissive hypertension first 24 h < 220/110. Hold home medications for now. BP goal after 24 hours, 130s.  - Telemetry monitoring for arrhythmia. - Recommend bedside Swallow screen. - Recommend  Stroke education. - Recommend PT/OT/SLP consult. - Recommend metabolic/infectious workup with UA with UCx, CXR, CK, serum lactate. -stroke team to follow.    Electronically signed by: Jacob Boll, MSN, APN-BC, nurse practitioner and by MD. Note/plan to be edited by MD as needed.  Pager: [phone]  I have seen the patient reviewed the above note the assessment and plan were jointly formulated.  The patient has fairly significant posterior circulation disease with stenosis of the vertebrals bilaterally as well as basilar.  He is currently asymptomatic, and I suspect that this is mostly diminutive posterior circulation as opposed to acute changes.  He is being admitted for monitoring overnight, and I would favor aggressive atherosclerotic risk factor control.  Stroke team to follow.  Jacob Rack, MD [facility] [phone]  If 7pm- 7am, please page neurology on call as listed in [place].

## 2020-08-14 NOTE — ED Notes (Signed)
Watching TV.  

## 2020-08-15 ENCOUNTER — Inpatient Hospital Stay (HOSPITAL_COMMUNITY): Payer: No Typology Code available for payment source

## 2020-08-15 DIAGNOSIS — G459 Transient cerebral ischemic attack, unspecified: Principal | ICD-10-CM

## 2020-08-15 DIAGNOSIS — E785 Hyperlipidemia, unspecified: Secondary | ICD-10-CM | POA: Diagnosis not present

## 2020-08-15 DIAGNOSIS — Z8673 Personal history of transient ischemic attack (TIA), and cerebral infarction without residual deficits: Secondary | ICD-10-CM

## 2020-08-15 DIAGNOSIS — I10 Essential (primary) hypertension: Secondary | ICD-10-CM

## 2020-08-15 DIAGNOSIS — R42 Dizziness and giddiness: Secondary | ICD-10-CM | POA: Diagnosis not present

## 2020-08-15 LAB — CBC
HCT: 38.6 % — ABNORMAL LOW (ref 39.0–52.0)
Hemoglobin: 12.3 g/dL — ABNORMAL LOW (ref 13.0–17.0)
MCH: 27.7 pg (ref 26.0–34.0)
MCHC: 31.9 g/dL (ref 30.0–36.0)
MCV: 86.9 fL (ref 80.0–100.0)
Platelets: 166 10*3/uL (ref 150–400)
RBC: 4.44 MIL/uL (ref 4.22–5.81)
RDW: 13 % (ref 11.5–15.5)
WBC: 6.9 10*3/uL (ref 4.0–10.5)
nRBC: 0 % (ref 0.0–0.2)

## 2020-08-15 LAB — LIPID PANEL
Cholesterol: 153 mg/dL (ref 0–200)
HDL: 32 mg/dL — ABNORMAL LOW (ref >40)
LDL Cholesterol: 98 mg/dL (ref 0–99)
Total CHOL/HDL Ratio: 4.8 RATIO
Triglycerides: 117 mg/dL (ref <150)
VLDL: 23 mg/dL (ref 0–40)

## 2020-08-15 LAB — BASIC METABOLIC PANEL
Anion gap: 11 (ref 5–15)
BUN: 16 mg/dL (ref 8–23)
CO2: 24 mmol/L (ref 22–32)
Calcium: 8.8 mg/dL — ABNORMAL LOW (ref 8.9–10.3)
Chloride: 103 mmol/L (ref 98–111)
Creatinine, Ser: 1.06 mg/dL (ref 0.61–1.24)
GFR, Estimated: >60 mL/min (ref >60)
Glucose, Bld: 114 mg/dL — ABNORMAL HIGH (ref 70–99)
Potassium: 3.8 mmol/L (ref 3.5–5.1)
Sodium: 138 mmol/L (ref 135–145)

## 2020-08-15 LAB — ECHOCARDIOGRAM COMPLETE
AR max vel: 2.82 cm2
AV Area VTI: 2.98 cm2
AV Area mean vel: 2.87 cm2
AV Mean grad: 4 mmHg
AV Peak grad: 9.1 mmHg
Ao pk vel: 1.51 m/s
Area-P 1/2: 4.15 cm2
Height: 75 in
S' Lateral: 3.3 cm
Single Plane A4C EF: 50.3 %
Weight: 3806.02 oz

## 2020-08-15 MED ORDER — ROSUVASTATIN CALCIUM 20 MG PO TABS: 20.0000 mg | ORAL_TABLET | Freq: Every day | ORAL | 0 refills | Status: DC

## 2020-08-15 MED ORDER — CLOPIDOGREL BISULFATE 75 MG PO TABS: 75.0000 mg | ORAL_TABLET | Freq: Every day | ORAL | 0 refills | Status: DC

## 2020-08-15 MED ORDER — ROSUVASTATIN CALCIUM 20 MG PO TABS
20.0000 mg | ORAL_TABLET | Freq: Every day | ORAL | Status: DC
  Administered 2020-08-15: 20 mg via ORAL
  Filled 2020-08-15: qty 1

## 2020-08-15 NOTE — Evaluation (Signed)
Occupational Therapy Evaluation Patient Details Name: Jacob [name] MRN: [id number] DOB: 10/26/1945 Today's Date: 08/15/2020    History of Present Illness 75 y/o male presented to ED on 4/15 with dizziness, blurred vision, and balance difficulty. Diagnosed with TIA given improvement of symptoms in ED. PMH: PTSD, afib, HTN, HLD,BPH, prostate cancer, GERD, peripheral neuropathy   Clinical Impression   PTA, pt was living at home alone, pt reports he was independent with ADL/IADL and functional mobility. No reports of falls. Pt currently requires supervision for functional mobility without AD. Pt completed short blessed test cognition screener and score indicates questionable impairment. Due to decline in current level of function, pt would benefit from acute OT to address established goals to facilitate safe D/C to venue listed below. Will continue to follow acutely to address cognition and independence with IADL.     Follow Up Recommendations  No OT follow up Pending further cognition, pt may benefit from [facility] for medication management   Equipment Recommendations  None recommended by OT    Recommendations for Other Services       Precautions / Restrictions Precautions Precautions: Fall Restrictions Weight Bearing Restrictions: No      Mobility Bed Mobility Overal bed mobility: Independent                  Transfers Overall transfer level: Needs assistance Equipment used: None Transfers: Sit to/from Stand Sit to Stand: Supervision         General transfer comment: supervision for safety    Balance Overall balance assessment: Mild deficits observed, not formally tested                                         ADL either performed or assessed with clinical judgement   ADL Overall ADL's : Needs assistance/impaired                                     Functional mobility during ADLs: Supervision/safety General ADL Comments: pt  requires supervision for all ADL/IADL and functional mobility without use of AD     Vision Baseline Vision/History: No visual deficits Patient Visual Report: No change from baseline Vision Assessment?: No apparent visual deficits     Perception     Praxis      Pertinent Vitals/Pain Pain Assessment: No/denies pain     Hand Dominance Right   Extremity/Trunk Assessment Upper Extremity Assessment Upper Extremity Assessment: Overall WFL for tasks assessed (sensation intact, strength equal)   Lower Extremity Assessment Lower Extremity Assessment: LLE deficits/detail LLE Deficits / Details: slightly weaker than RLE;LLE 4/5 RLE 5/5 WFL and ROM WNL   Cervical / Trunk Assessment Cervical / Trunk Assessment: Normal   Communication Communication Communication: No difficulties   Cognition Arousal/Alertness: Awake/alert Behavior During Therapy: WFL for tasks assessed/performed Overall Cognitive Status: No family/caregiver present to determine baseline cognitive functioning                                 General Comments: Pt scored 6/28 on the short blessed test screener for cognition indicating questionable impairment and warrants further evaluation. Pt reports he feels he is at his baseline. Will continue to further assess.   General Comments  VSS Hr 60s at rest, 70s with exertion  Exercises     Shoulder Instructions      Home Living Family/patient expects to be discharged to:: Private residence Living Arrangements: Alone   Type of Home: Mobile home Home Access: Level entry     Home Layout: One level     Bathroom Shower/Tub: Tub/shower unit;Walk-in shower   Bathroom Toilet: Standard Bathroom Accessibility: Yes How Accessible: Accessible via walker Home Equipment: Shower seat          Prior Functioning/Environment Level of Independence: Independent        Comments: independent with all ADL/IADL, still drives and does own grocery shopping and  medication management.        OT Problem List: Decreased cognition;Impaired balance (sitting and/or standing)      OT Treatment/Interventions: Self-care/ADL training;Cognitive remediation/compensation;Patient/family education;Balance training    OT Goals(Current goals can be found in the care plan section) Acute Rehab OT Goals Patient Stated Goal: to go home OT Goal Formulation: With patient Time For Goal Achievement: 08/29/20 Potential to Achieve Goals: Good ADL Goals Additional ADL Goal #1: Pt will complete multistep cog task<3 errors for safe progression of IADL in the home. Additional ADL Goal #2: Pt will demonstrate independence with medication management.  OT Frequency: Min 2X/week   Barriers to D/C:            Co-evaluation              AM-PAC OT "6 Clicks" Daily Activity     Outcome Measure Help from another person eating meals?: None Help from another person taking care of personal grooming?: A Little Help from another person toileting, which includes using toliet, bedpan, or urinal?: A Little Help from another person bathing (including washing, rinsing, drying)?: A Little Help from another person to put on and taking off regular upper body clothing?: A Little Help from another person to put on and taking off regular lower body clothing?: A Little 6 Click Score: 19   End of Session Equipment Utilized During Treatment: Gait belt Nurse Communication: Mobility status  Activity Tolerance: Patient tolerated treatment well Patient left: in bed;with call bell/phone within reach;with bed alarm set  OT Visit Diagnosis: Other abnormalities of gait and mobility (R26.89);Other symptoms and signs involving cognitive function                Time: 1165-7903 OT Time Calculation (min): 19 min Charges:  OT General Charges $OT Visit: 1 Visit OT Evaluation $OT Eval Moderate Complexity: [place] OTR/L Acute Rehabilitation Services Office: [place] 08/15/2020, 10:18 AM

## 2020-08-15 NOTE — Progress Notes (Signed)
2D echocardiogram completed. Refer to "CV Proc" under chart review to view preliminary results.  08/15/2020 3:02 PM Kelby Aline., MHA, RVT, RDCS, RDMS

## 2020-08-15 NOTE — Discharge Instructions (Addendum)
Jacob [name],  It was a pleasure taking care of you in the hospital. You were admitted with dizziness and found to have had a mini-stroke (also known as transient ischemic attack). You were seen by neurology (brain doctors) while here. We monitored your vitals, did blood work, and took pictures of your brain and heart.   - You were started on a new blood thinner called clopidogrel (Plavix) 75 mg daily. You will continue this and your aspirin for 21 days. After the 21 days, you will take Plavix alone and discontinue the aspirin.  - While you are on both aspirin and Plavix, you are at an increased risk of bleeding, so it is important for you to be extra careful getting around.  - We changed your cholesterol medicine from simvastatin to a stronger one called rosuvastatin 20 mg daily. Stop the simvastatin and continue rosuvastatin only.  - Continue to take your other medications as prescribed.  - Encourage you to eat healthfully and exercise.   Please follow-up with your primary care doctor in the next week. You will also need to call to schedule follow up with [facility] (number provided in this paperwork).   Take care!      Transient Ischemic Attack A transient ischemic attack (TIA) causes the same symptoms as a stroke, but the symptoms go away quickly. A TIA happens when blood flow to the brain is blocked. Having a TIA means you may be at risk for a stroke. A TIA is a medical emergency. What are the causes? A TIA is caused by a blocked artery in the head or neck. This means the brain does not get the blood supply it needs. A blockage can be caused by:  Fatty buildup in an artery in the head or neck.  A blood clot.  A tear in an artery.  Irritation and swelling (inflammation) of an artery. Sometimes the cause is not known. What increases the risk? Certain things may make you more likely to have a TIA. Some of these are things that you can change, such as:  Being  very overweight.  Using products that have nicotine or tobacco.  Taking birth control pills.  Not being active.  Drinking too much alcohol.  Using drugs. Health conditions that may increase your risk include:  High blood pressure.  High cholesterol.  Diabetes.  Heart disease.  A heartbeat that is not regular (atrial fibrillation).  Sickle cell disease.  Sleep problems (sleep apnea).  Long-term diseases that cause irritation and swelling.  Problems with blood clotting. Other risk factors include:  Being over the age of 21.  Being male.  Having a family history of stroke.  Having had blood clots, stroke, TIA, or heart attack in the past.  Having a history of high blood pressure when pregnant (preeclampsia).  Very bad headaches (migraines). What are the signs or symptoms? The symptoms of a TIA are like those of a stroke. They can include:  Weakness or loss of feeling in your face, arm, or leg. This often happens on one side of your body.  Trouble walking.  Trouble moving your arms or legs.  Trouble talking or understanding what people are saying.  Problems with how you see.  Feeling dizzy.  Feeling confused.  Loss of balance or coordination.  Feeling like you may vomit (nausea) or you vomit.  Having a very bad headache. If you can, note what time you started to have symptoms. Tell your doctor.   How is  this treated? The goal of treatment is to lower the risk for a stroke. This may include:  Changes to diet and lifestyle, such as getting regular exercise and stopping smoking.  Taking medicines to: ? Thin the blood. ? Lower blood pressure. ? Lower cholesterol.  Treating other health conditions, such as diabetes. If testing shows that an artery in your brain is narrow, your doctor may recommend a procedure to:  Take the blockage out of your artery (carotid endarterectomy).  Open or widen an artery in your neck (carotid angioplasty and  stenting). Follow these instructions at home: Medicines  Take over-the-counter and prescription medicines only as told by your doctor.  If you were told to take aspirin or another medicine to thin your blood, take it exactly as told by your doctor. ? Taking too much of the medicine can cause bleeding. ? Taking too little of the medicine may not work to treat the problem. Eating and drinking  Eat 5 or more servings of fruits and vegetables each day.  Follow instructions from your doctor about your diet. You may need to follow a certain diet to help lower your risk of a stroke. You may need to: ? Eat a diet that is low in fat and salt. ? Eat foods with a lot of fiber. ? Limit carbohydrates and sugar.  If you drink alcohol: ? Limit how much you have to:  0-1 drink a day for women who are not pregnant.  0-2 drinks a day for men. ? Know how much alcohol is in a drink. In the [place] one drink equals one 12 oz bottle of beer (326mL), one 5 oz glass of wine (162mL), or one 1 oz glass of hard liquor (15mL).   General instructions  Keep a healthy weight.  Try to get at least 30 minutes of exercise on most days.  Get treatment if you have sleep problems.  Do not smoke or use any products that contain nicotine or tobacco. If you need help quitting, ask your doctor.  Do not use drugs.  Keep all follow-up visits. Where to find more information  American Stroke Association: www.stroke.org Get help right away if:  You have chest pain.  You have a heartbeat that is not regular.  You have any signs of a stroke. "BE FAST" is an easy way to remember the main warning signs: ? B - Balance. Dizziness, sudden trouble walking, or loss of balance. ? E - Eyes. Trouble seeing or a change in how you see. ? F - Face. Sudden weakness or loss of feeling of the face. The face or eyelid may droop on one side. ? A - Arms. Weakness or loss of feeling in an arm. This happens all of a sudden and most  often on one side of the body. ? S - Speech. Sudden trouble speaking, slurred speech, or trouble understanding what people say. ? T - Time. Time to call emergency services. Write down what time symptoms started.  You have other signs of a stroke, such as: ? A sudden, very bad headache with no known cause. ? Feeling like you may vomit. ? Vomiting. ? A seizure. These symptoms may be an emergency. Get help right away. Call your local emergency services (911 in the [place]  Do not wait to see if the symptoms will go away.  Do not drive yourself to the hospital. Summary  A transient ischemic attack (TIA) happens when an artery in the head or neck is blocked.  This causes the same symptoms as a stroke. The symptoms go away quickly.  A TIA is a medical emergency. Get help right away, even if your symptoms go away.  Having a TIA means that you may be at risk for a stroke. Taking medicines and making diet and lifestyle changes can help to prevent a stroke. This information is not intended to replace advice given to you by your health care provider. Make sure you discuss any questions you have with your health care provider. Document Revised: 11/12/2019 Document Reviewed: 11/12/2019 Elsevier Patient Education  [place].

## 2020-08-15 NOTE — Progress Notes (Addendum)
Neurology Progress Note   S:// Jacob [name] is sitting in bed eating breakfast.  He denies any new neuro events overnight. He complaints of ocasional dizziness. NO family present    O:// Current vital signs: BP (!) 135/59 (BP Location: Left Arm)   Pulse (!) 55   Temp (!) 97.5 F (36.4 C) (Oral)   Resp 18   Ht 6\' 3"  (1.905 m)   Wt 107.9 kg   SpO2 100%   BMI 29.73 kg/m  Vital signs in last 24 hours: Temp:  [97.4 F (36.3 C)-98.4 F (36.9 C)] 97.5 F (36.4 C) (04/16 1204) Pulse Rate:  [46-58] 55 (04/16 1206) Resp:  [15-19] 18 (04/16 1204) BP: (121-173)/(54-88) 135/59 (04/16 1204) SpO2:  [94 %-100 %] 100 % (04/16 1206) Weight:  [107.9 kg] 107.9 kg (04/16 0421)   GENERAL: Awake, alert in NAD HEENT: - Normocephalic and atraumatic, dry mm LUNGS - Clear to auscultation bilaterally with no wheezes CV - S1S2 RRR, no m/r/g, equal pulses bilaterally. ABDOMEN - Soft, nontender, nondistended with normoactive BS Ext: warm, well perfused, intact peripheral pulses,  NEURO:  Mental Status: AA&Ox3  Language: speech is clear  Naming, repetition, fluency, and comprehension intact. Cranial Nerves: PERRL 32mm/brisk. EOMI, visual fields full, no facial asymmetry, facial sensation intact, hearing intact, tongue/uvula/soft palate midline, normal sternocleidomastoid and trapezius muscle strength. No evidence of tongue atrophy or fibrillations Motor: 5/5 in all extremities  Tone: is normal and bulk is normal Sensation- Intact to light touch bilaterally Coordination: FTN intact bilaterally, no ataxia in BLE. Gait- deferred  NIHSS 0  Medications  Current Facility-Administered Medications:  .  acetaminophen (TYLENOL) tablet 650 mg, 650 mg, Oral, Q6H PRN **OR** acetaminophen (TYLENOL) suppository 650 mg, 650 mg, Rectal, Q6H PRN, Alexandria Lodge, MD .  aspirin EC tablet 81 mg, 81 mg, Oral, Daily, Kirby-Graham, Karsten Fells, NP, 81 mg at 08/15/20 0939 .  citalopram (CELEXA) tablet 20 mg, 20 mg,  Oral, Daily, Alexandria Lodge, MD, 20 mg at 08/15/20 0939 .  clopidogrel (PLAVIX) tablet 75 mg, 75 mg, Oral, Daily, Kirby-Graham, Karsten Fells, NP, 75 mg at 08/15/20 0939 .  divalproex (DEPAKOTE) DR tablet 1,000 mg, 1,000 mg, Oral, Daily, Alexandria Lodge, MD, 1,000 mg at 08/15/20 0943 .  enoxaparin (LOVENOX) injection 40 mg, 40 mg, Subcutaneous, Q24H, Christian, Rylee, MD, 40 mg at 08/14/20 2213 .  fluticasone (FLONASE) 50 MCG/ACT nasal spray 1 spray, 1 spray, Each Nare, Daily, Alexandria Lodge, MD .  gabapentin (NEURONTIN) capsule 300 mg, 300 mg, Oral, q AM, Alexandria Lodge, MD, 300 mg at 08/15/20 0618 .  gabapentin (NEURONTIN) tablet 600 mg, 600 mg, Oral, QHS, Alexandria Lodge, MD, 600 mg at 08/14/20 2214 .  HYDROcodone-acetaminophen (NORCO/VICODIN) 5-325 MG per tablet 1 tablet, 1 tablet, Oral, Once PRN, Alexandria Lodge, MD .  polyethylene glycol (MIRALAX / GLYCOLAX) packet 17 g, 17 g, Oral, Daily PRN, Alexandria Lodge, MD .  rosuvastatin (CRESTOR) tablet 20 mg, 20 mg, Oral, Daily, Christian, Rylee, MD, 20 mg at 08/15/20 0940 .  traZODone (DESYREL) tablet 25 mg, 25 mg, Oral, QHS PRN, Alexandria Lodge, MD Labs CBC    Component Value Date/Time   WBC 6.9 08/15/2020 0204   RBC 4.44 08/15/2020 0204   HGB 12.3 (L) 08/15/2020 0204   HCT 38.6 (L) 08/15/2020 0204   PLT 166 08/15/2020 0204   MCV 86.9 08/15/2020 0204   MCH 27.7 08/15/2020 0204   MCHC 31.9 08/15/2020 0204   RDW 13.0 08/15/2020 0204   LYMPHSABS 1.1 08/14/2020 1521  MONOABS 0.4 08/14/2020 1521   EOSABS 0.1 08/14/2020 1521   BASOSABS 0.0 08/14/2020 1521    CMP     Component Value Date/Time   NA 138 08/15/2020 0204   K 3.8 08/15/2020 0204   CL 103 08/15/2020 0204   CO2 24 08/15/2020 0204   GLUCOSE 114 (H) 08/15/2020 0204   BUN 16 08/15/2020 0204   CREATININE 1.06 08/15/2020 0204   CALCIUM 8.8 (L) 08/15/2020 0204   PROT 7.6 08/14/2020 1521   ALBUMIN 4.0 08/14/2020 1521   AST 24 08/14/2020 1521   ALT 24 08/14/2020 1521   ALKPHOS 45 08/14/2020  1521   BILITOT 0.9 08/14/2020 1521   GFRNONAA >60 08/15/2020 0204   GFRAA >60 04/23/2017 1023    glycosylated hemoglobin  Lipid Panel     Component Value Date/Time   CHOL 153 08/15/2020 0204   TRIG 117 08/15/2020 0204   HDL 32 (L) 08/15/2020 0204   CHOLHDL 4.8 08/15/2020 0204   VLDL 23 08/15/2020 0204   LDLCALC 98 08/15/2020 0204     Imaging I have reviewed images in epic and the results pertinent to this consultation are:  CT-scan of the brain No acute abnormality   CTA Head/ Neck  1. Common carotid and internal carotid arteries are patent within the neck without hemodynamically significant stenosis. Mild atherosclerotic disease within both carotid systems as described. 2. The vertebral arteries are developmentally diminutive, but patent within the neck. Suspected moderate/severe stenosis at the origin of the right vertebral artery. Streak artifact from a dense left-sided contrast bolus precludes evaluation of the origin of the left vertebral artery. 3. Nonspecific 16 mm cystic appearing cutaneous/subcutaneous lesion within the posterior left upper neck. Direct visualization is recommended.  Neck  1. Markedly irregular and stenotic appearance of the V4 vertebral arteries bilaterally, and of the proximal basilar artery with possible partial occlusion of these vessels (the basilar artery is developmentally diminutive and there are large bilateral posterior communicating arteries). 2. Additional intracranial atherosclerotic disease as described without large vessel occlusion or proximal high-grade arterial stenosis identified elsewhere.  MRI examination of the brain 1. No evidence of acute intracranial abnormality. 2. 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  MRA head:  1. No appreciable flow-related signal within the intracranial vertebral arteries. 2. Additionally, the proximal basilar artery  is poorly delineated. 3. Flow related signal is seen within the proximal PICAs bilaterally. 4. Flow related signal is present within the mid to distal basilar artery (the basilar artery is developmentally diminutive).  Assessment:   Jacob [name] is a 75 yo [place] male with a PMHx of BPH, DM II, prostate cancer, PTSD, major depression, HTN, GERD, HLD, DDD, spinal stenosis, peripheral neuropathy, and likely dementia (patient on Aricept on [place] record) who presents as a code stroke per ED triage PA-C. Patient came in with dizziness and ambulation difficulty and when seen in triage, code stroke was called. Patient was taken emergently to CT suite and had a CTH and CTA head and neck. His symptoms of dizziness completely resolved in the CT suite.   Recommendations: - LDL 98, continue Crestor  - continue bASA/plavix  - Continue lovenox for DVT ppx - echo pending - HGA1c pending  - follow up with neurology as outpatient - Neurology will sign off   Beulah Gandy, DNP, ACNPC-AG  ATTENDING NOTE: I reviewed above note and agree with the assessment and plan. Pt was seen and examined.  75 year old male with history of diabetes, hypertension, hyperlipidemia, prostate cancer, dementia admitted for episode of dizziness, difficulty walking and blurry vision.  Patient only admitted for dizziness episode lasting an hour, denies any difficulty walking or blurry vision.  Currently patient denies any neuro symptoms.  CT no acute abnormality.  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  MRI negative for acute infarct.  LDL 98, A1c pending.  EF 60 to 65%.  On exam, patient lying in bed, no family at bedside.  Patient awake alert, orientated x3, slight dysarthria but no aphasia, follows simple commands, able to name and repeat.  Neuro intact without focal deficit.  Patient episode likely posterior TIA, given hypoplastic posterior circulation.  Recommend to avoid low  BP, BP goal 130-150.  Continue aspirin 81 and Plavix 75 DAPT for 3 weeks and then Plavix alone.  Change Zocor 40 to Crestor 20, continue statin on discharge.  Neurology will sign off. Please call with questions. Pt will follow up with stroke clinic NP at [facility] in about 4 weeks. Thanks for the consult.  Rosalin Hawking, MD PhD Stroke Neurology 08/15/2020 6:25 PM

## 2020-08-15 NOTE — Discharge Summary (Signed)
Name: Jacob [name] MRN: [id number] DOB: December 25, 1945 75 y.o. PCP: [facility], [place], [place]  Date of Admission: 08/14/2020  2:58 PM Date of Discharge: 08/15/2020 Attending Physician: Angelica Pou, MD  Discharge Diagnosis:  1. Transient ischemic attack 2. Essential hypertension 3. Hyperlipidemia LDL goal <70  Discharge Medications: Allergies as of 08/15/2020   No Known Allergies     Medication List    STOP taking these medications   omeprazole 20 MG capsule Commonly known as: PRILOSEC   simvastatin 40 MG tablet Commonly known as: ZOCOR     TAKE these medications   albuterol 108 (90 Base) MCG/ACT inhaler Commonly known as: VENTOLIN HFA Inhale 2 puffs into the lungs 4 (four) times daily as needed (For cough per [facility]).   aspirin EC 81 MG tablet Take 81 mg by mouth daily. Swallow whole.   CALCIUM/VITAMIN D PO Take 1 tablet by mouth daily.   citalopram 40 MG tablet Commonly known as: CELEXA Take 20 mg by mouth daily.   clopidogrel 75 MG tablet Commonly known as: PLAVIX Take 1 tablet (75 mg total) by mouth daily for 21 days. Start taking on: August 16, 2020   cyclobenzaprine 5 MG tablet Commonly known as: FLEXERIL Take 1 tablet (5 mg total) by mouth 2 (two) times daily as needed for muscle spasms.   cyclobenzaprine 5 MG tablet Commonly known as: FLEXERIL Take 1 tablet (5 mg total) by mouth at bedtime as needed for muscle spasms.   divalproex 500 MG 24 hr tablet Commonly known as: DEPAKOTE ER Take 1,000 mg by mouth daily.   fluticasone 50 MCG/ACT nasal spray Commonly known as: FLONASE Place 1 spray into both nostrils 2 (two) times daily as needed for allergies.   gabapentin 300 MG capsule Commonly known as: NEURONTIN Take 300-600 mg by mouth See admin instructions. Take one capsule by mouth every morning and take two capsules at bedtime per [facility]   HYDROcodone-acetaminophen 5-325 MG tablet Commonly known as: NORCO/VICODIN Take 1 tablet by mouth  every 6 (six) hours as needed for moderate pain.   ibuprofen 600 MG tablet Commonly known as: ADVIL Take 1 tablet (600 mg total) by mouth 2 (two) times daily with a meal.   lisinopril 5 MG tablet Commonly known as: ZESTRIL Take 5 mg by mouth daily.   loratadine 10 MG tablet Commonly known as: CLARITIN Take 10 mg by mouth daily.   Magnesium Oxide 420 MG Tabs Take 1 tablet by mouth daily.   meloxicam 15 MG tablet Commonly known as: MOBIC Take 15 mg by mouth daily.   multivitamin with minerals Tabs tablet Take 1 tablet by mouth daily.   rosuvastatin 20 MG tablet Commonly known as: CRESTOR Take 1 tablet (20 mg total) by mouth daily. Start taking on: August 16, 2020   terazosin 2 MG capsule Commonly known as: HYTRIN Take 2 mg by mouth at bedtime.   traZODone 50 MG tablet Commonly known as: DESYREL Take 25 mg by mouth at bedtime as needed for sleep.       Disposition and follow-up:   Mr.Jacob [name] was discharged from [facility] in Stable condition.  At the hospital follow up visit please address:  1.    Transient ischemic attack - Ensure patient takes DAPT with clopidogrel 75 mg daily and aspirin 81 mg for 21 days after which he should STOP aspirin and continue clopidogrel 75 mg indefinitely - Ensure patient picked up and started rosuvastatin 20 mg daily. He was instructed to  STOP simvastatin.  Medication reconciliation - Patient's med list includes high dose donepezil 23 mg daily which is indicated for severe dementia. Consider stopping as he does not appear to have dementia given his functionality. - He is also prescribed terazosin for BPH. This should be prescribed with extra caution given its propensity to cause orthostatic hypotension, his age, and high fall risk. - He is prescribed citalopram 40 mg daily. Max dose is 20 mg daily for people >79 yo due to risk for QTc prolongation. Consider decreasing dose.  2.  Labs / imaging needed at time  of follow-up: none  3.  Pending labs/ test needing follow-up: Hemoglobin A1c  Follow-up Appointments:  Follow-up Information    [facility]. Schedule an appointment as soon as possible for a visit in 4 week(s).   Specialty: Neurology Contact information: [address] [place]. Schedule an appointment as soon as possible for a visit in 1 week(s).   Specialty: General Practice Why: Schedule an appointment with your primary care doctor. Contact information: [address] [place] [id number] [phone]              Hospital Course by problem list:  Jacob [name] is a 75 y.o. man with past medical history of HTN, HLD, pre-diabetes, BPH, prostate cancer, GERD, degenerative disc disease, spinal stenosis, chronic back pain, peripheral neuropathy, history of exposure to Agent Orange, PTSD, major depression, possible dementia (patient on Aricept on [place] record) who presented to the [facility] ED on 08/14/20 with acute onset dizziness, treated as a code stroke on arrival, and admitted for TIA work-up and risk factor modification.  Transient ischemic attack due to decreased perfusion through diminutive posterior circulation HTN, HLD, pre-diabetes Presented to the ED with dizziness and difficulty ambulating with last known normal ~3 hours prior and treated as a code stroke. CTH negative for acute infarct. CTA head and neck with moderate to severe stenosis of R vertebral artery and irregular and stenotic appearance of V4 bilaterally. Additionally stenosis to the proximal basilar artery with possible partial occlusion. No tPA was given because of mild symptoms which had resolved and NIHSS of zero. MRI brain and MRA head and neck showed diminutive posterior circulation.  ABCD2 score of 4 (age >60, BP >140/90, duration >60 min). DAPT with aspirin and Plavix was initiated. Risk factors include HTN,  HLD, pre-diabetes, dietary indiscretion, inconsistent medication non-adherence. TIA work-up and risk factor modification demonstrated LDL 98 (goal <70), so patient was started on rosuvastatin 20 mg daily (home simvastatin 40 mg daily was discontinued). On morning of discharge, patient reported he does not recall similar symptoms happening when looking up, looking down, or turning his head, or when feeling dehydrated. He was feeling nearly back to baseline: Mentation normal, Gait balance only very slightly off (perceptible only to himself, not to observer).  He was able to demonstrate negative Romberg, a stable gait, though needed some assistance with tandem gait.  Abnormal cerebellar findings absent on neurologic exam  which included finger-to-nose and rapid repeating movements. Symptoms attributed to a TIA due to decreased perfusion through posterior vessels.   ~~~~~~~~~~~~~~~~~~~~~~~~~~~~~~~~~~~~~~~~~~~~~~~~~~~~~~~~~~~~~~~~~~~~~~~~~~~~~~~~~  Day of Discharge Subjective:  No acute events overnight. During evaluation this morning, Mr. Davidow states he is feeling "so much better" compared to yesterday. He reports yesterday he was feeling "out of it" but today feels clearer-minded. Notes a small amount of residual dizziness which is notices when he sits  up or stands quickly. He states he has been up to the bathroom without issue. He notes "yesterday was a wake-up call." He admits he did not always consistently take his blood pressure and cholesterol medication. States he plans to make dietary changes such as cutting "way down" on his greasy and fatty food consumption. He states he is ready to go home and "make some changes."  Discharge Exam:   BP (!) 147/79 (BP Location: Left Arm)   Pulse (!) 57   Temp 98.2 F (36.8 C) (Oral)   Resp 20   Ht 6\' 3"  (1.905 m)   Wt 107.9 kg   SpO2 100%   BMI 29.73 kg/m  Constitutional: well-appearing man sitting up in bed, in no acute distress, more talkative  today HENT: normocephalic atraumatic, mucous membranes moist Cardiovascular: bradycardia, regular rhythm, no m/r/g, no lower extremity edema Pulmonary/Chest: normal work of breathing on room air, lungs clear to auscultation bilaterally, no wheezes, rales, or rhonchi Neurological: alert & oriented to person, place, time, and situation; No dysmetria with finger-to-nose. No dysdiadochokinesia. Negative Romberg. Gait is slightly ataxic, more noticeable with heel-to-shin walking.  Psych: normal mood and affect, pleasant and talkative  Pertinent Labs, Studies, and Procedures:   MR ANGIO HEAD WO CONTRAST  Result Date: 08/14/2020 CLINICAL DATA:  Transient ischemic attack (TIA). Additional history provided: Dizziness, blurred vision, off balance. EXAM: MRI HEAD WITHOUT CONTRAST MRA HEAD WITHOUT CONTRAST TECHNIQUE: Multiplanar, multiecho pulse sequences of the brain and surrounding structures were obtained without intravenous contrast. Angiographic images of the head were obtained using MRA technique without contrast. COMPARISON:  Noncontrast head CT and CT angiogram head/neck performed earlier today 08/14/2020. Brain MRI 10/13/2010. FINDINGS: MRI HEAD FINDINGS Brain: Cerebral volume is normal for age. Mild multifocal T2/FLAIR hyperintensity within the cerebral white matter is nonspecific, but compatible with chronic small vessel ischemic disease. There is no acute infarct. No evidence of intracranial mass. No chronic intracranial blood products. No extra-axial fluid collection. No midline shift. Vascular: Reported below. Skull and upper cervical spine: No focal marrow lesion. Sinuses/Orbits: Visualized orbits show no acute finding. Trace bilateral ethmoid sinus mucosal thickening. Small bilateral maxillary sinus mucous retention cysts. Other: Redemonstrated nonspecific 2.2 cm cystic appearing cutaneous/subcutaneous lesion within the posterior left upper neck. MRA HEAD FINDINGS The intracranial internal carotid  arteries are patent. The M1 middle cerebral arteries are patent. No M2 proximal branch occlusion or high-grade proximal stenosis is identified. The anterior cerebral arteries are patent. Hypoplastic right A1 segment. There is no appreciable flow related signal within the intracranial vertebral arteries. The proximal basilar artery is also poorly delineated. Flow related signal is seen within the PICAs bilaterally. Flow related signal is appreciated within the mid to distal basilar artery (the basilar artery is developmentally diminutive). Large bilateral posterior communicating arteries. The posterior cerebral arteries are patent. No intracranial aneurysm is identified. IMPRESSION: MRI brain: 1. No evidence of acute intracranial abnormality. 2. 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 MRA head: 1. No appreciable flow-related signal within the intracranial vertebral arteries. 2. Additionally, the proximal basilar artery is poorly delineated. 3. Flow related signal is seen within the proximal PICAs bilaterally. 4. Flow related signal is present within the mid to distal basilar artery (the basilar artery is developmentally diminutive). Electronically Signed   By: Kellie Simmering DO   On: 08/14/2020 18:46   MR BRAIN WO CONTRAST  Result Date: 08/14/2020 CLINICAL DATA:  Transient ischemic attack (TIA).  Additional history provided: Dizziness, blurred vision, off balance. EXAM: MRI HEAD WITHOUT CONTRAST MRA HEAD WITHOUT CONTRAST TECHNIQUE: Multiplanar, multiecho pulse sequences of the brain and surrounding structures were obtained without intravenous contrast. Angiographic images of the head were obtained using MRA technique without contrast. COMPARISON:  Noncontrast head CT and CT angiogram head/neck performed earlier today 08/14/2020. Brain MRI 10/13/2010. FINDINGS: MRI HEAD FINDINGS Brain: Cerebral volume is normal for age. Mild multifocal  T2/FLAIR hyperintensity within the cerebral white matter is nonspecific, but compatible with chronic small vessel ischemic disease. There is no acute infarct. No evidence of intracranial mass. No chronic intracranial blood products. No extra-axial fluid collection. No midline shift. Vascular: Reported below. Skull and upper cervical spine: No focal marrow lesion. Sinuses/Orbits: Visualized orbits show no acute finding. Trace bilateral ethmoid sinus mucosal thickening. Small bilateral maxillary sinus mucous retention cysts. Other: Redemonstrated nonspecific 2.2 cm cystic appearing cutaneous/subcutaneous lesion within the posterior left upper neck. MRA HEAD FINDINGS The intracranial internal carotid arteries are patent. The M1 middle cerebral arteries are patent. No M2 proximal branch occlusion or high-grade proximal stenosis is identified. The anterior cerebral arteries are patent. Hypoplastic right A1 segment. There is no appreciable flow related signal within the intracranial vertebral arteries. The proximal basilar artery is also poorly delineated. Flow related signal is seen within the PICAs bilaterally. Flow related signal is appreciated within the mid to distal basilar artery (the basilar artery is developmentally diminutive). Large bilateral posterior communicating arteries. The posterior cerebral arteries are patent. No intracranial aneurysm is identified. IMPRESSION: MRI brain: 1. No evidence of acute intracranial abnormality. 2. 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 MRA head: 1. No appreciable flow-related signal within the intracranial vertebral arteries. 2. Additionally, the proximal basilar artery is poorly delineated. 3. Flow related signal is seen within the proximal PICAs bilaterally. 4. Flow related signal is present within the mid to distal basilar artery (the basilar artery is developmentally diminutive).  Electronically Signed   By: Kellie Simmering DO   On: 08/14/2020 18:46   CT HEAD CODE STROKE WO CONTRAST  Result Date: 08/14/2020 CLINICAL DATA:  Code stroke. Neuro deficit, acute, stroke suspected; dizziness, visual disturbance. Additional provided vision, weakness. EXAM: CT HEAD WITHOUT CONTRAST TECHNIQUE: Contiguous axial images were obtained from the base of the skull through the vertex without intravenous contrast. COMPARISON:  Noncontrast head CT 04/13/2017.  Brain MRI 10/13/2010. FINDINGS: Brain: Cerebral volume is normal. There is no acute intracranial hemorrhage. No demarcated cortical infarct. No extra-axial fluid collection. No evidence of intracranial mass. No midline shift. Vascular: No hyperdense vessel.  Atherosclerotic calcifications. Skull: Normal. Negative for fracture or focal lesion. Sinuses/Orbits: Visualized orbits show no acute finding. Small bilateral maxillary sinus mucous retention cysts at the imaged levels. ASPECTS [facility] Stroke Program Early CT Score) - Ganglionic level infarction (caudate, lentiform nuclei, internal capsule, insula, M1-M3 cortex): 7 - Supraganglionic infarction (M4-M6 cortex): 3 Total score (0-10 with 10 being normal): 10 These results were communicated to Dr. Leonel Ramsay at 3:44 pmon 4/15/2022by text page via the [facility] messaging system. IMPRESSION: No evidence of acute intracranial abnormality. ASPECTS is 10. Electronically Signed   By: Kellie Simmering DO   On: 08/14/2020 15:44   CT ANGIO HEAD CODE STROKE  Result Date: 08/14/2020 CLINICAL DATA:  Neuro deficit, acute, stroke suspected.  Weakness. EXAM: CT ANGIOGRAPHY HEAD AND NECK TECHNIQUE: Multidetector CT imaging of the head and neck was performed using the standard protocol during bolus  administration of intravenous contrast. Multiplanar CT image reconstructions and MIPs were obtained to evaluate the vascular anatomy. Carotid stenosis measurements (when applicable) are obtained utilizing NASCET criteria, using  the distal internal carotid diameter as the denominator. CONTRAST:  75 mL Omnipaque 350 intravenous contrast. COMPARISON:  Noncontrast head CT performed earlier today 08/14/2020. FINDINGS: CTA NECK FINDINGS Aortic arch: Standard aortic branching. Atherosclerotic plaque within the visualized aortic arch and proximal major branch vessels of the neck. No hemodynamically significant innominate or proximal subclavian artery stenosis. Right carotid system: CCA and ICA patent within the neck without hemodynamically significant stenosis. Mild soft and calcified plaque within the proximal ICA. Prominent soft and calcified plaque at the origin of the ECA. Left carotid system: CCA and ICA patent within the neck. Soft and calcified plaque within the carotid bifurcation and proximal ICA results in less than 50% stenosis of the proximal ICA. Mild soft and calcified plaque is also present within the mid to distal CCA. Vertebral arteries: The vertebral arteries are developmentally diminutive bilaterally, but patent within the neck. Suspected moderate/severe stenosis at the origin of the right vertebral artery. Streak artifact from a dense left-sided contrast bolus precludes evaluation of the origin of the left vertebral artery. Skeleton: No acute bony abnormality or aggressive osseous lesion. Cervical spondylosis. Prominent Schmorl node within the C6 superior endplate. Advanced disc space narrowing and possible fusion across the disc space at C6-C7. Other neck: No mass or cervical lymphadenopathy. Nonspecific 16 mm cystic appearing cutaneous/subcutaneous lesion within the posterior left upper neck. Upper chest: No consolidation within the imaged lung apices. Review of the MIP images confirms the above findings CTA HEAD FINDINGS Anterior circulation: The intracranial internal carotid arteries are patent. Plaque within both vessels with sites of mild stenosis. The M1 middle cerebral arteries are patent. Atherosclerotic irregularity  of the M2 and more distal MCA branch vessels bilaterally. However, no M2 proximal branch occlusion or high-grade proximal stenosis is identified. The anterior cerebral arteries are patent. Plastic right A1 segment. No intracranial aneurysm is identified. Posterior circulation: The intracranial right vertebral artery is markedly irregular and stenotic in appearance beyond the origin of the right PICA with possible partial vessel occlusion. Similarly, the V4 left vertebral artery is markedly irregular and stenotic in appearance with possible partial occlusion. The basilar artery is developmentally diminutive. The proximal basilar artery is markedly irregular and stenotic in appearance. More robust reconstitution of enhancement within the basilar artery at its mid to distal aspect. There are large bilateral posterior communicating arteries which are patent. The posterior cerebral arteries are patent. Venous sinuses: Within the limitations of contrast timing, no convincing thrombus. Anatomic variants: As described Review of the MIP images confirms the above findings These results were called by telephone at the time of interpretation on 08/14/2020 at 4:25 pm to provider Dr. Leonel Ramsay, who verbally acknowledged these results. IMPRESSION: CTA neck: 1. Common carotid and internal carotid arteries are patent within the neck without hemodynamically significant stenosis. Mild atherosclerotic disease within both carotid systems as described. 2. The vertebral arteries are developmentally diminutive, but patent within the neck. Suspected moderate/severe stenosis at the origin of the right vertebral artery. Streak artifact from a dense left-sided contrast bolus precludes evaluation of the origin of the left vertebral artery. 3. Nonspecific 16 mm cystic appearing cutaneous/subcutaneous lesion within the posterior left upper neck. Direct visualization is recommended. CTA head: 1. Markedly irregular and stenotic appearance of the  V4 vertebral arteries bilaterally, and of the proximal basilar artery with possible partial occlusion of  these vessels (the basilar artery is developmentally diminutive and there are large bilateral posterior communicating arteries). 2. Additional intracranial atherosclerotic disease as described without large vessel occlusion or proximal high-grade arterial stenosis identified elsewhere. Electronically Signed   By: Kellie Simmering DO   On: 08/14/2020 16:41   CT ANGIO NECK CODE STROKE  Result Date: 08/14/2020 CLINICAL DATA:  Neuro deficit, acute, stroke suspected.  Weakness. EXAM: CT ANGIOGRAPHY HEAD AND NECK TECHNIQUE: Multidetector CT imaging of the head and neck was performed using the standard protocol during bolus administration of intravenous contrast. Multiplanar CT image reconstructions and MIPs were obtained to evaluate the vascular anatomy. Carotid stenosis measurements (when applicable) are obtained utilizing NASCET criteria, using the distal internal carotid diameter as the denominator. CONTRAST:  75 mL Omnipaque 350 intravenous contrast. COMPARISON:  Noncontrast head CT performed earlier today 08/14/2020. FINDINGS: CTA NECK FINDINGS Aortic arch: Standard aortic branching. Atherosclerotic plaque within the visualized aortic arch and proximal major branch vessels of the neck. No hemodynamically significant innominate or proximal subclavian artery stenosis. Right carotid system: CCA and ICA patent within the neck without hemodynamically significant stenosis. Mild soft and calcified plaque within the proximal ICA. Prominent soft and calcified plaque at the origin of the ECA. Left carotid system: CCA and ICA patent within the neck. Soft and calcified plaque within the carotid bifurcation and proximal ICA results in less than 50% stenosis of the proximal ICA. Mild soft and calcified plaque is also present within the mid to distal CCA. Vertebral arteries: The vertebral arteries are developmentally diminutive  bilaterally, but patent within the neck. Suspected moderate/severe stenosis at the origin of the right vertebral artery. Streak artifact from a dense left-sided contrast bolus precludes evaluation of the origin of the left vertebral artery. Skeleton: No acute bony abnormality or aggressive osseous lesion. Cervical spondylosis. Prominent Schmorl node within the C6 superior endplate. Advanced disc space narrowing and possible fusion across the disc space at C6-C7. Other neck: No mass or cervical lymphadenopathy. Nonspecific 16 mm cystic appearing cutaneous/subcutaneous lesion within the posterior left upper neck. Upper chest: No consolidation within the imaged lung apices. Review of the MIP images confirms the above findings CTA HEAD FINDINGS Anterior circulation: The intracranial internal carotid arteries are patent. Plaque within both vessels with sites of mild stenosis. The M1 middle cerebral arteries are patent. Atherosclerotic irregularity of the M2 and more distal MCA branch vessels bilaterally. However, no M2 proximal branch occlusion or high-grade proximal stenosis is identified. The anterior cerebral arteries are patent. Plastic right A1 segment. No intracranial aneurysm is identified. Posterior circulation: The intracranial right vertebral artery is markedly irregular and stenotic in appearance beyond the origin of the right PICA with possible partial vessel occlusion. Similarly, the V4 left vertebral artery is markedly irregular and stenotic in appearance with possible partial occlusion. The basilar artery is developmentally diminutive. The proximal basilar artery is markedly irregular and stenotic in appearance. More robust reconstitution of enhancement within the basilar artery at its mid to distal aspect. There are large bilateral posterior communicating arteries which are patent. The posterior cerebral arteries are patent. Venous sinuses: Within the limitations of contrast timing, no convincing  thrombus. Anatomic variants: As described Review of the MIP images confirms the above findings These results were called by telephone at the time of interpretation on 08/14/2020 at 4:25 pm to provider Dr. Leonel Ramsay, who verbally acknowledged these results. IMPRESSION: CTA neck: 1. Common carotid and internal carotid arteries are patent within the neck without hemodynamically significant stenosis. Mild atherosclerotic  disease within both carotid systems as described. 2. The vertebral arteries are developmentally diminutive, but patent within the neck. Suspected moderate/severe stenosis at the origin of the right vertebral artery. Streak artifact from a dense left-sided contrast bolus precludes evaluation of the origin of the left vertebral artery. 3. Nonspecific 16 mm cystic appearing cutaneous/subcutaneous lesion within the posterior left upper neck. Direct visualization is recommended. CTA head: 1. Markedly irregular and stenotic appearance of the V4 vertebral arteries bilaterally, and of the proximal basilar artery with possible partial occlusion of these vessels (the basilar artery is developmentally diminutive and there are large bilateral posterior communicating arteries). 2. Additional intracranial atherosclerotic disease as described without large vessel occlusion or proximal high-grade arterial stenosis identified elsewhere. Electronically Signed   By: Kellie Simmering DO   On: 08/14/2020 16:41    Discharge Instructions: Discharge Instructions    Ambulatory referral to Neurology   Complete by: As directed    Follow up with stroke clinic NP (Jessica Vanschaick or Cecille Rubin, if both not available, consider Leonie Man, Penumali, or Ahern) at [facility] in about 4 weeks. Thanks.   Call MD for:  difficulty breathing, headache or visual disturbances   Complete by: As directed    Call MD for:  extreme fatigue   Complete by: As directed    Call MD for:  persistant dizziness or light-headedness   Complete by: As  directed    Call MD for:  persistant nausea and vomiting   Complete by: As directed    Call MD for:  severe uncontrolled pain   Complete by: As directed    Call MD for:  temperature >100.4   Complete by: As directed    Increase activity slowly   Complete by: As directed      Mr. Kurek,  It was a pleasure taking care of you in the hospital. You were admitted with dizziness and found to have had a mini-stroke (also known as transient ischemic attack). You were seen by neurology (brain doctors) while here. We monitored your vitals, did blood work, and took pictures of your brain and heart.   - You were started on a new blood thinner called clopidogrel (Plavix) 75 mg daily. You will continue this and your aspirin for 21 days. After the 21 days, you will take Plavix alone and discontinue the aspirin.  - While you are on both aspirin and Plavix, you are at an increased risk of bleeding, so it is important for you to be extra careful getting around.  - We changed your cholesterol medicine from simvastatin to a stronger one called rosuvastatin. Stop the simvastatin and continue rosuvastatin only.  - Continue to take your other medications as prescribed.  - Encourage you to eat healthfully and exercise.   Please follow-up with your primary care doctor in the next week. You will also need to call to schedule follow up with [facility] (number provided in this paperwork).   Take care!  Signed: Alexandria Lodge, MD 08/15/2020, 5:18 PM   Pager: [phone]

## 2020-08-15 NOTE — Progress Notes (Signed)
Reviewed d/c paperwork with pt, no questions or concerns. Pt safely wheeled down to main entrance by RN

## 2020-08-15 NOTE — Progress Notes (Incomplete)
HD#1 Subjective:   Overnight Events: ***  ***  Objective:   Vital signs in last 24 hours: Vitals:   08/14/20 2000 08/14/20 2057 08/15/20 0012 08/15/20 0421  BP: (!) 168/77 (!) 167/78 (!) 143/65 (!) 121/54  Pulse: (!) 46 (!) 53 (!) 56 (!) 54  Resp: 15 18 18 16   Temp:  98.1 F (36.7 C) 98.4 F (36.9 C) 97.9 F (36.6 C)  TempSrc:  Oral Oral Oral  SpO2: 96% 100% 97% 95%   Supplemental O2: {NAMES:3044014::"Room Air","Nasal Cannula","Simple Face Mask","Partial Rebreather","HFNC","Non Rebreather","Venturi Mask","Bag Valve Mask"} SpO2: 95 %  Physical Exam Constitutional: well-appearing *** sitting in chair, in no acute distress HENT: normocephalic atraumatic, mucous membranes moist Eyes: conjunctiva non-erythematous Neck: supple Cardiovascular: regular rate and rhythm, no m/r/g Pulmonary/Chest: normal work of breathing on room air, lungs clear to auscultation bilaterally Abdominal: soft, non-tender, non-distended MSK: normal bulk and tone Neurological: alert & oriented x 3, 5/5 strength in bilateral upper and lower extremities, normal gait Skin: *** Psych: ***  There were no vitals filed for this visit.  No intake or output data in the 24 hours ending 08/15/20 0616 Net IO Since Admission: No IO data has been entered for this period [08/15/20 0616]  Pertinent Labs: CBC Latest Ref Rng & Units 08/15/2020 08/14/2020 08/14/2020  WBC 4.0 - 10.5 K/uL 6.9 - 7.9  Hemoglobin 13.0 - 17.0 g/dL 12.3(L) 15.6 14.3  Hematocrit 39.0 - 52.0 % 38.6(L) 46.0 45.2  Platelets 150 - 400 K/uL 166 - 189    CMP Latest Ref Rng & Units 08/15/2020 08/14/2020 08/14/2020  Glucose 70 - 99 mg/dL 114(H) 121(H) 128(H)  BUN 8 - 23 mg/dL 16 18 15   Creatinine 0.61 - 1.24 mg/dL 1.06 1.20 1.19  Sodium 135 - 145 mmol/L 138 141 138  Potassium 3.5 - 5.1 mmol/L 3.8 4.7 4.4  Chloride 98 - 111 mmol/L 103 102 104  CO2 22 - 32 mmol/L 24 - 29  Calcium 8.9 - 10.3 mg/dL 8.8(L) - 9.2  Total Protein 6.5 - 8.1 g/dL - -  7.6  Total Bilirubin 0.3 - 1.2 mg/dL - - 0.9  Alkaline Phos 38 - 126 U/L - - 45  AST 15 - 41 U/L - - 24  ALT 0 - 44 U/L - - 24    Imaging: MR ANGIO HEAD WO CONTRAST  Result Date: 08/14/2020 CLINICAL DATA:  Transient ischemic attack (TIA). Additional history provided: Dizziness, blurred vision, off balance. EXAM: MRI HEAD WITHOUT CONTRAST MRA HEAD WITHOUT CONTRAST TECHNIQUE: Multiplanar, multiecho pulse sequences of the brain and surrounding structures were obtained without intravenous contrast. Angiographic images of the head were obtained using MRA technique without contrast. COMPARISON:  Noncontrast head CT and CT angiogram head/neck performed earlier today 08/14/2020. Brain MRI 10/13/2010. FINDINGS: MRI HEAD FINDINGS Brain: Cerebral volume is normal for age. Mild multifocal T2/FLAIR hyperintensity within the cerebral white matter is nonspecific, but compatible with chronic small vessel ischemic disease. There is no acute infarct. No evidence of intracranial mass. No chronic intracranial blood products. No extra-axial fluid collection. No midline shift. Vascular: Reported below. Skull and upper cervical spine: No focal marrow lesion. Sinuses/Orbits: Visualized orbits show no acute finding. Trace bilateral ethmoid sinus mucosal thickening. Small bilateral maxillary sinus mucous retention cysts. Other: Redemonstrated nonspecific 2.2 cm cystic appearing cutaneous/subcutaneous lesion within the posterior left upper neck. MRA HEAD FINDINGS The intracranial internal carotid arteries are patent. The M1 middle cerebral arteries are patent. No M2 proximal branch occlusion or high-grade proximal stenosis is  identified. The anterior cerebral arteries are patent. Hypoplastic right A1 segment. There is no appreciable flow related signal within the intracranial vertebral arteries. The proximal basilar artery is also poorly delineated. Flow related signal is seen within the PICAs bilaterally. Flow related signal is  appreciated within the mid to distal basilar artery (the basilar artery is developmentally diminutive). Large bilateral posterior communicating arteries. The posterior cerebral arteries are patent. No intracranial aneurysm is identified. IMPRESSION: MRI brain: 1. No evidence of acute intracranial abnormality. 2. 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 MRA head: 1. No appreciable flow-related signal within the intracranial vertebral arteries. 2. Additionally, the proximal basilar artery is poorly delineated. 3. Flow related signal is seen within the proximal PICAs bilaterally. 4. Flow related signal is present within the mid to distal basilar artery (the basilar artery is developmentally diminutive). Electronically Signed   By: Kellie Simmering DO   On: 08/14/2020 18:46   MR BRAIN WO CONTRAST  Result Date: 08/14/2020 CLINICAL DATA:  Transient ischemic attack (TIA). Additional history provided: Dizziness, blurred vision, off balance. EXAM: MRI HEAD WITHOUT CONTRAST MRA HEAD WITHOUT CONTRAST TECHNIQUE: Multiplanar, multiecho pulse sequences of the brain and surrounding structures were obtained without intravenous contrast. Angiographic images of the head were obtained using MRA technique without contrast. COMPARISON:  Noncontrast head CT and CT angiogram head/neck performed earlier today 08/14/2020. Brain MRI 10/13/2010. FINDINGS: MRI HEAD FINDINGS Brain: Cerebral volume is normal for age. Mild multifocal T2/FLAIR hyperintensity within the cerebral white matter is nonspecific, but compatible with chronic small vessel ischemic disease. There is no acute infarct. No evidence of intracranial mass. No chronic intracranial blood products. No extra-axial fluid collection. No midline shift. Vascular: Reported below. Skull and upper cervical spine: No focal marrow lesion. Sinuses/Orbits: Visualized orbits show no acute finding. Trace bilateral  ethmoid sinus mucosal thickening. Small bilateral maxillary sinus mucous retention cysts. Other: Redemonstrated nonspecific 2.2 cm cystic appearing cutaneous/subcutaneous lesion within the posterior left upper neck. MRA HEAD FINDINGS The intracranial internal carotid arteries are patent. The M1 middle cerebral arteries are patent. No M2 proximal branch occlusion or high-grade proximal stenosis is identified. The anterior cerebral arteries are patent. Hypoplastic right A1 segment. There is no appreciable flow related signal within the intracranial vertebral arteries. The proximal basilar artery is also poorly delineated. Flow related signal is seen within the PICAs bilaterally. Flow related signal is appreciated within the mid to distal basilar artery (the basilar artery is developmentally diminutive). Large bilateral posterior communicating arteries. The posterior cerebral arteries are patent. No intracranial aneurysm is identified. IMPRESSION: MRI brain: 1. No evidence of acute intracranial abnormality. 2. 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 MRA head: 1. No appreciable flow-related signal within the intracranial vertebral arteries. 2. Additionally, the proximal basilar artery is poorly delineated. 3. Flow related signal is seen within the proximal PICAs bilaterally. 4. Flow related signal is present within the mid to distal basilar artery (the basilar artery is developmentally diminutive). Electronically Signed   By: Kellie Simmering DO   On: 08/14/2020 18:46   CT HEAD CODE STROKE WO CONTRAST  Result Date: 08/14/2020 CLINICAL DATA:  Code stroke. Neuro deficit, acute, stroke suspected; dizziness, visual disturbance. Additional provided vision, weakness. EXAM: CT HEAD WITHOUT CONTRAST TECHNIQUE: Contiguous axial images were obtained from the base of the skull through the vertex without intravenous contrast. COMPARISON:  Noncontrast  head CT  04/13/2017.  Brain MRI 10/13/2010. FINDINGS: Brain: Cerebral volume is normal. There is no acute intracranial hemorrhage. No demarcated cortical infarct. No extra-axial fluid collection. No evidence of intracranial mass. No midline shift. Vascular: No hyperdense vessel.  Atherosclerotic calcifications. Skull: Normal. Negative for fracture or focal lesion. Sinuses/Orbits: Visualized orbits show no acute finding. Small bilateral maxillary sinus mucous retention cysts at the imaged levels. ASPECTS [facility] Stroke Program Early CT Score) - Ganglionic level infarction (caudate, lentiform nuclei, internal capsule, insula, M1-M3 cortex): 7 - Supraganglionic infarction (M4-M6 cortex): 3 Total score (0-10 with 10 being normal): 10 These results were communicated to Dr. Leonel Ramsay at 3:44 pmon 4/15/2022by text page via the [facility] messaging system. IMPRESSION: No evidence of acute intracranial abnormality. ASPECTS is 10. Electronically Signed   By: Kellie Simmering DO   On: 08/14/2020 15:44   CT ANGIO HEAD CODE STROKE  Result Date: 08/14/2020 CLINICAL DATA:  Neuro deficit, acute, stroke suspected.  Weakness. EXAM: CT ANGIOGRAPHY HEAD AND NECK TECHNIQUE: Multidetector CT imaging of the head and neck was performed using the standard protocol during bolus administration of intravenous contrast. Multiplanar CT image reconstructions and MIPs were obtained to evaluate the vascular anatomy. Carotid stenosis measurements (when applicable) are obtained utilizing NASCET criteria, using the distal internal carotid diameter as the denominator. CONTRAST:  75 mL Omnipaque 350 intravenous contrast. COMPARISON:  Noncontrast head CT performed earlier today 08/14/2020. FINDINGS: CTA NECK FINDINGS Aortic arch: Standard aortic branching. Atherosclerotic plaque within the visualized aortic arch and proximal major branch vessels of the neck. No hemodynamically significant innominate or proximal subclavian artery stenosis. Right carotid  system: CCA and ICA patent within the neck without hemodynamically significant stenosis. Mild soft and calcified plaque within the proximal ICA. Prominent soft and calcified plaque at the origin of the ECA. Left carotid system: CCA and ICA patent within the neck. Soft and calcified plaque within the carotid bifurcation and proximal ICA results in less than 50% stenosis of the proximal ICA. Mild soft and calcified plaque is also present within the mid to distal CCA. Vertebral arteries: The vertebral arteries are developmentally diminutive bilaterally, but patent within the neck. Suspected moderate/severe stenosis at the origin of the right vertebral artery. Streak artifact from a dense left-sided contrast bolus precludes evaluation of the origin of the left vertebral artery. Skeleton: No acute bony abnormality or aggressive osseous lesion. Cervical spondylosis. Prominent Schmorl node within the C6 superior endplate. Advanced disc space narrowing and possible fusion across the disc space at C6-C7. Other neck: No mass or cervical lymphadenopathy. Nonspecific 16 mm cystic appearing cutaneous/subcutaneous lesion within the posterior left upper neck. Upper chest: No consolidation within the imaged lung apices. Review of the MIP images confirms the above findings CTA HEAD FINDINGS Anterior circulation: The intracranial internal carotid arteries are patent. Plaque within both vessels with sites of mild stenosis. The M1 middle cerebral arteries are patent. Atherosclerotic irregularity of the M2 and more distal MCA branch vessels bilaterally. However, no M2 proximal branch occlusion or high-grade proximal stenosis is identified. The anterior cerebral arteries are patent. Plastic right A1 segment. No intracranial aneurysm is identified. Posterior circulation: The intracranial right vertebral artery is markedly irregular and stenotic in appearance beyond the origin of the right PICA with possible partial vessel occlusion.  Similarly, the V4 left vertebral artery is markedly irregular and stenotic in appearance with possible partial occlusion. The basilar artery is developmentally diminutive. The proximal basilar artery is markedly irregular and stenotic in appearance. More robust reconstitution of enhancement within the  basilar artery at its mid to distal aspect. There are large bilateral posterior communicating arteries which are patent. The posterior cerebral arteries are patent. Venous sinuses: Within the limitations of contrast timing, no convincing thrombus. Anatomic variants: As described Review of the MIP images confirms the above findings These results were called by telephone at the time of interpretation on 08/14/2020 at 4:25 pm to provider Dr. Leonel Ramsay, who verbally acknowledged these results. IMPRESSION: CTA neck: 1. Common carotid and internal carotid arteries are patent within the neck without hemodynamically significant stenosis. Mild atherosclerotic disease within both carotid systems as described. 2. The vertebral arteries are developmentally diminutive, but patent within the neck. Suspected moderate/severe stenosis at the origin of the right vertebral artery. Streak artifact from a dense left-sided contrast bolus precludes evaluation of the origin of the left vertebral artery. 3. Nonspecific 16 mm cystic appearing cutaneous/subcutaneous lesion within the posterior left upper neck. Direct visualization is recommended. CTA head: 1. Markedly irregular and stenotic appearance of the V4 vertebral arteries bilaterally, and of the proximal basilar artery with possible partial occlusion of these vessels (the basilar artery is developmentally diminutive and there are large bilateral posterior communicating arteries). 2. Additional intracranial atherosclerotic disease as described without large vessel occlusion or proximal high-grade arterial stenosis identified elsewhere. Electronically Signed   By: Kellie Simmering DO   On:  08/14/2020 16:41   CT ANGIO NECK CODE STROKE  Result Date: 08/14/2020 CLINICAL DATA:  Neuro deficit, acute, stroke suspected.  Weakness. EXAM: CT ANGIOGRAPHY HEAD AND NECK TECHNIQUE: Multidetector CT imaging of the head and neck was performed using the standard protocol during bolus administration of intravenous contrast. Multiplanar CT image reconstructions and MIPs were obtained to evaluate the vascular anatomy. Carotid stenosis measurements (when applicable) are obtained utilizing NASCET criteria, using the distal internal carotid diameter as the denominator. CONTRAST:  75 mL Omnipaque 350 intravenous contrast. COMPARISON:  Noncontrast head CT performed earlier today 08/14/2020. FINDINGS: CTA NECK FINDINGS Aortic arch: Standard aortic branching. Atherosclerotic plaque within the visualized aortic arch and proximal major branch vessels of the neck. No hemodynamically significant innominate or proximal subclavian artery stenosis. Right carotid system: CCA and ICA patent within the neck without hemodynamically significant stenosis. Mild soft and calcified plaque within the proximal ICA. Prominent soft and calcified plaque at the origin of the ECA. Left carotid system: CCA and ICA patent within the neck. Soft and calcified plaque within the carotid bifurcation and proximal ICA results in less than 50% stenosis of the proximal ICA. Mild soft and calcified plaque is also present within the mid to distal CCA. Vertebral arteries: The vertebral arteries are developmentally diminutive bilaterally, but patent within the neck. Suspected moderate/severe stenosis at the origin of the right vertebral artery. Streak artifact from a dense left-sided contrast bolus precludes evaluation of the origin of the left vertebral artery. Skeleton: No acute bony abnormality or aggressive osseous lesion. Cervical spondylosis. Prominent Schmorl node within the C6 superior endplate. Advanced disc space narrowing and possible fusion across  the disc space at C6-C7. Other neck: No mass or cervical lymphadenopathy. Nonspecific 16 mm cystic appearing cutaneous/subcutaneous lesion within the posterior left upper neck. Upper chest: No consolidation within the imaged lung apices. Review of the MIP images confirms the above findings CTA HEAD FINDINGS Anterior circulation: The intracranial internal carotid arteries are patent. Plaque within both vessels with sites of mild stenosis. The M1 middle cerebral arteries are patent. Atherosclerotic irregularity of the M2 and more distal MCA branch vessels bilaterally. However, no M2  proximal branch occlusion or high-grade proximal stenosis is identified. The anterior cerebral arteries are patent. Plastic right A1 segment. No intracranial aneurysm is identified. Posterior circulation: The intracranial right vertebral artery is markedly irregular and stenotic in appearance beyond the origin of the right PICA with possible partial vessel occlusion. Similarly, the V4 left vertebral artery is markedly irregular and stenotic in appearance with possible partial occlusion. The basilar artery is developmentally diminutive. The proximal basilar artery is markedly irregular and stenotic in appearance. More robust reconstitution of enhancement within the basilar artery at its mid to distal aspect. There are large bilateral posterior communicating arteries which are patent. The posterior cerebral arteries are patent. Venous sinuses: Within the limitations of contrast timing, no convincing thrombus. Anatomic variants: As described Review of the MIP images confirms the above findings These results were called by telephone at the time of interpretation on 08/14/2020 at 4:25 pm to provider Dr. Leonel Ramsay, who verbally acknowledged these results. IMPRESSION: CTA neck: 1. Common carotid and internal carotid arteries are patent within the neck without hemodynamically significant stenosis. Mild atherosclerotic disease within both carotid  systems as described. 2. The vertebral arteries are developmentally diminutive, but patent within the neck. Suspected moderate/severe stenosis at the origin of the right vertebral artery. Streak artifact from a dense left-sided contrast bolus precludes evaluation of the origin of the left vertebral artery. 3. Nonspecific 16 mm cystic appearing cutaneous/subcutaneous lesion within the posterior left upper neck. Direct visualization is recommended. CTA head: 1. Markedly irregular and stenotic appearance of the V4 vertebral arteries bilaterally, and of the proximal basilar artery with possible partial occlusion of these vessels (the basilar artery is developmentally diminutive and there are large bilateral posterior communicating arteries). 2. Additional intracranial atherosclerotic disease as described without large vessel occlusion or proximal high-grade arterial stenosis identified elsewhere. Electronically Signed   By: Kellie Simmering DO   On: 08/14/2020 16:41    {medication reviewed/display:3041432}  Assessment/Plan:   Principal Problem:   Transient ischemic attack (TIA) Active Problems:   Essential hypertension   Hyperlipidemia LDL goal <70   Patient Summary: Jacob [name] is a 75 y.o. *** with a pertinent PMH of ***, who presented with *** and was admitted for ***.    *** ***  *** ***  *** ***  *** ***  Diet: {NAMES:3044014::"Normal","Heart Healthy","Carb-Modified","Renal","Carb/Renal","NPO","TPN","Tube Feeds"} IVF: {NAMES:3044014::"None","NS","1/2 NS","LR","D5","D10"},{NAMES:3044014::"None","10cc/hr","25cc/hr","50cc/hr","75cc/hr","100cc/hr","110cc/hr","125cc/hr","Bolus"} VTE: {NAMES:3044014::"Heparin","Enoxaparin","SCDs","NOAC","None"} Code: {NAMES:3044014::"Full","DNR","DNI","DNR/DNI","Comfort Care","Unknown"} PT/OT recs: {NAMES:3044014::"None","Pending","CIR","SNF for Subacute PT","LTAC","Home Health"}, {[employer] TLX:72620}. TOC recs: ***   Dispo: Anticipated  discharge to {Discharge Destination:18313::"Home"} in {NUMBERS:20191} days pending ***.    Please contact the on call pager after 5 pm and on weekends at [phone].  Alexandria Lodge, MD PGY-1 Internal Medicine Teaching Service Pager: [phone] 08/15/2020

## 2020-08-15 NOTE — Plan of Care (Signed)
  Problem: Education: Goal: Knowledge of General Education information will improve Description: Including pain rating scale, medication(s)/side effects and non-pharmacologic comfort measures Outcome: Adequate for Discharge   Problem: Health Behavior/Discharge Planning: Goal: Ability to manage health-related needs will improve Outcome: Adequate for Discharge   Problem: Clinical Measurements: Goal: Ability to maintain clinical measurements within normal limits will improve Outcome: Adequate for Discharge Goal: Will remain free from infection Outcome: Adequate for Discharge Goal: Diagnostic test results will improve Outcome: Adequate for Discharge Goal: Respiratory complications will improve Outcome: Adequate for Discharge Goal: Cardiovascular complication will be avoided Outcome: Adequate for Discharge   Problem: Activity: Goal: Risk for activity intolerance will decrease Outcome: Adequate for Discharge   Problem: Nutrition: Goal: Adequate nutrition will be maintained Outcome: Adequate for Discharge   Problem: Coping: Goal: Level of anxiety will decrease Outcome: Adequate for Discharge   Problem: Elimination: Goal: Will not experience complications related to bowel motility Outcome: Adequate for Discharge Goal: Will not experience complications related to urinary retention Outcome: Adequate for Discharge   Problem: Pain Managment: Goal: General experience of comfort will improve Outcome: Adequate for Discharge   Problem: Safety: Goal: Ability to remain free from injury will improve Outcome: Adequate for Discharge   Problem: Skin Integrity: Goal: Risk for impaired skin integrity will decrease Outcome: Adequate for Discharge   Problem: Education: Goal: Knowledge of secondary prevention will improve Outcome: Adequate for Discharge

## 2020-08-15 NOTE — Evaluation (Signed)
Physical Therapy Evaluation Patient Details Name: Jacob [name] MRN: [id number] DOB: 02-Oct-1945 Today's Date: 08/15/2020   History of Present Illness  74 y/o male presented to ED on 4/15 with dizziness, blurred vision, and balance difficulty. Diagnosed with TIA given improvement of symptoms in ED. PMH: PTSD, afib, HTN, HLD,BPH, prostate cancer, GERD, peripheral neuropathy  Clinical Impression  PTA, patient lives alone and reports independence with mobility. Patient presents with impaired balance, generalized weakness, and decreased activity tolerance. Patient ambulated 200' with min guard-minA and no AD, performed DGI scoring 20/24 which is above the cutoff for increased risk of falls. Patient will benefit from skilled PT services during acute stay to address listed deficits. No PT follow up recommended at this time as patient not agreeable to follow up PT, stating "I will get to normal on my own."    Follow Up Recommendations No PT follow up (patient not agreeable to follow up PT, states "I will get to normal on my own")    Equipment Recommendations  None recommended by PT    Recommendations for Other Services       Precautions / Restrictions Precautions Precautions: Fall Restrictions Weight Bearing Restrictions: No      Mobility  Bed Mobility Overal bed mobility: Independent                  Transfers Overall transfer level: Needs assistance Equipment used: None Transfers: Sit to/from Stand Sit to Stand: Supervision         General transfer comment: supervision for safety  Ambulation/Gait Ambulation/Gait assistance: Min guard;Min assist Gait Distance (Feet): 200 Feet Assistive device: None Gait Pattern/deviations: Step-through pattern;Drifts right/left;Wide base of support   Gait velocity interpretation: >2.62 ft/sec, indicative of community ambulatory General Gait Details: min guard majority of time. Required minA during DGI due to drifting L/R and minor  LOBs  Stairs Stairs: Yes Stairs assistance: Supervision Stair Management: No rails;Alternating pattern;Forwards Number of Stairs: 2 General stair comments: supervision for safety  Wheelchair Mobility    Modified Rankin (Stroke Patients Only)       Balance Overall balance assessment: Mild deficits observed, not formally tested                               Standardized Balance Assessment Standardized Balance Assessment : Dynamic Gait Index   Dynamic Gait Index Level Surface: Normal Change in Gait Speed: Mild Impairment Gait with Horizontal Head Turns: Mild Impairment Gait with Vertical Head Turns: Mild Impairment Gait and Pivot Turn: Normal Step Over Obstacle: Normal Step Around Obstacles: Mild Impairment Steps: Normal Total Score: 20       Pertinent Vitals/Pain Pain Assessment: No/denies pain    Home Living Family/patient expects to be discharged to:: Private residence Living Arrangements: Alone   Type of Home: Mobile home Home Access: Level entry     Home Layout: One level Home Equipment: Shower seat      Prior Function Level of Independence: Independent         Comments: independent with all ADL/IADL, still drives and does own grocery shopping and medication management.     Hand Dominance        Extremity/Trunk Assessment   Upper Extremity Assessment Upper Extremity Assessment: Defer to OT evaluation    Lower Extremity Assessment Lower Extremity Assessment: LLE deficits/detail LLE Deficits / Details: slightly weaker than RLE;LLE 4/5 RLE 5/5 WFL and ROM WNL    Cervical / Trunk Assessment Cervical /  Trunk Assessment: Normal  Communication   Communication: No difficulties  Cognition Arousal/Alertness: Awake/alert Behavior During Therapy: WFL for tasks assessed/performed Overall Cognitive Status: No family/caregiver present to determine baseline cognitive functioning                                 General  Comments: attempted to call in breakfast order. Provided instructions on how to call. Patient attempted x 4 prior therapist providing step by step instructions      General Comments      Exercises     Assessment/Plan    PT Assessment Patient needs continued PT services  PT Problem List Decreased strength;Decreased activity tolerance;Decreased balance;Decreased mobility;Decreased safety awareness       PT Treatment Interventions DME instruction;Gait training;Functional mobility training;Therapeutic activities;Therapeutic exercise;Balance training;Patient/family education    PT Goals (Current goals can be found in the Care Plan section)  Acute Rehab PT Goals Patient Stated Goal: to go home PT Goal Formulation: With patient Time For Goal Achievement: 08/29/20 Potential to Achieve Goals: Good    Frequency Min 4X/week   Barriers to discharge        Co-evaluation               AM-PAC PT "6 Clicks" Mobility  Outcome Measure Help needed turning from your back to your side while in a flat bed without using bedrails?: None Help needed moving from lying on your back to sitting on the side of a flat bed without using bedrails?: None Help needed moving to and from a bed to a chair (including a wheelchair)?: A Little Help needed standing up from a chair using your arms (e.g., wheelchair or bedside chair)?: A Little Help needed to walk in hospital room?: A Little Help needed climbing 3-5 steps with a railing? : A Little 6 Click Score: 20    End of Session Equipment Utilized During Treatment: Gait belt Activity Tolerance: Patient tolerated treatment well Patient left: in bed;with call bell/phone within reach;with bed alarm set Nurse Communication: Mobility status PT Visit Diagnosis: Unsteadiness on feet (R26.81);Muscle weakness (generalized) (M62.81)    Time: 8938-1017 PT Time Calculation (min) (ACUTE ONLY): 15 min   Charges:   PT Evaluation $PT Eval Low Complexity: 1  Low          Eleanna Theilen A. Gilford Rile PT, DPT Acute Rehabilitation Services Pager [phone] Office [phone]   Linna Hoff 08/15/2020, 12:14 PM

## 2020-08-17 LAB — HEMOGLOBIN A1C
Hgb A1c MFr Bld: 6.5 % — ABNORMAL HIGH (ref 4.8–5.6)
Mean Plasma Glucose: 140 mg/dL

## 2020-08-18 ENCOUNTER — Other Ambulatory Visit: Payer: Self-pay | Admitting: Student

## 2020-08-18 MED ORDER — CLOPIDOGREL BISULFATE 75 MG PO TABS: 75.0000 mg | ORAL_TABLET | Freq: Every day | ORAL | 0 refills | Status: DC

## 2020-08-18 MED ORDER — ROSUVASTATIN CALCIUM 20 MG PO TABS: 20.0000 mg | ORAL_TABLET | Freq: Every day | ORAL | 0 refills | Status: DC

## 2020-08-19 ENCOUNTER — Other Ambulatory Visit: Payer: Self-pay

## 2020-08-19 ENCOUNTER — Emergency Department (HOSPITAL_COMMUNITY)
Admission: EM | Admit: 2020-08-19 | Discharge: 2020-08-20 | Disposition: A | Discharge status: Home / Self Care | Payer: No Typology Code available for payment source | Attending: Student | Admitting: Student

## 2020-08-19 ENCOUNTER — Encounter (HOSPITAL_COMMUNITY): Payer: Self-pay

## 2020-08-19 DIAGNOSIS — E119 Type 2 diabetes mellitus without complications: Secondary | ICD-10-CM | POA: Insufficient documentation

## 2020-08-19 DIAGNOSIS — Z7982 Long term (current) use of aspirin: Secondary | ICD-10-CM | POA: Insufficient documentation

## 2020-08-19 DIAGNOSIS — I10 Essential (primary) hypertension: Secondary | ICD-10-CM | POA: Diagnosis not present

## 2020-08-19 DIAGNOSIS — Z79899 Other long term (current) drug therapy: Secondary | ICD-10-CM | POA: Insufficient documentation

## 2020-08-19 DIAGNOSIS — Z760 Encounter for issue of repeat prescription: Secondary | ICD-10-CM

## 2020-08-19 HISTORY — DX: Type 2 diabetes mellitus without complications: E11.9

## 2020-08-19 HISTORY — DX: Essential (primary) hypertension: I10

## 2020-08-19 NOTE — ED Triage Notes (Signed)
Needs plavix and rosuvastatin prescription. Denies any pain, shortness of breath. Denies any complaints.

## 2020-08-19 NOTE — ED Triage Notes (Signed)
Emergency Medicine Provider Triage Evaluation Note  Jacob [name] , a 75 y.o. male  was evaluated in triage.  Pt requesting medication refill.  Reports that his Plavix and follow-up rosuvastatin prescriptions were sent to the wrong pharmacy and he has been trying for 3 to 4 days to get these medications without success.  Needs them to be sent to the [place]. No medical complaints.  Review of Systems  Positive: none Negative: Chest pain, shortness of breath, abdominal pain  Physical Exam  BP 130/71 (BP Location: Left Arm)   Pulse (!) 59   Temp 99.1 F (37.3 C) (Oral)   Resp 16   Ht 6\' 3"  (1.905 m)   Wt 107.9 kg   SpO2 98%   BMI 29.73 kg/m  Gen:   Awake, no distress   HEENT:  Atraumatic  Resp:  Normal effort  Cardiac:  Normal rate  Abd:   Nondistended MSK:   Moves extremities without difficulty  Neuro:  Speech clear   Medical Decision Making  Medically screening exam initiated at 9:07.  Appropriate orders placed.  Jacob [name] was informed that the remainder of the evaluation will be completed by another provider, this initial triage assessment does not replace that evaluation, and the importance of remaining in the ED until their evaluation is complete.  Clinical Impression  1. Medication refill   Jacqlyn Larsen, [place] 08/19/20 2148

## 2020-08-20 ENCOUNTER — Encounter (HOSPITAL_COMMUNITY): Payer: Self-pay | Admitting: Student

## 2020-08-20 ENCOUNTER — Telehealth: Payer: Self-pay

## 2020-08-20 MED ORDER — CLOPIDOGREL BISULFATE 75 MG PO TABS: 75.0000 mg | ORAL_TABLET | Freq: Every day | ORAL | 0 refills | Status: AC

## 2020-08-20 MED ORDER — ROSUVASTATIN CALCIUM 20 MG PO TABS: 20.0000 mg | ORAL_TABLET | Freq: Every day | ORAL | 0 refills | Status: AC

## 2020-08-20 NOTE — ED Provider Notes (Signed)
[facility] EMERGENCY DEPARTMENT Provider Note   CSN: [id number] Arrival date & time: 08/19/20  2021     History Chief Complaint  Patient presents with  . Medication Refill    Jacob [name] is a 75 y.o. male history of hypertension, type, hyperlipidemia, and prior TIA who presents to the emergency department requesting medication refill of plavix & rosuvastatin.  Recently discharged from the hospital for a TIA , plavix & rosuvastatin were sent to pharmacy however he cannot get these filled there and needs hard copies to take to the [place]. He is currently asymptomatic. No alleviating/aggravating factors. Denies chest pain, dyspnea, numbness, weakness, or dizziness.   HPI     Past Medical History:  Diagnosis Date  . Diabetes mellitus without complication ([place])   . Hypertension     Patient Active Problem List   Diagnosis Date Noted  . Transient ischemic attack (TIA) 08/14/2020  . Essential hypertension 08/14/2020  . Hyperlipidemia LDL goal <70 08/14/2020  . BPH (benign prostatic hyperplasia) 08/14/2020  . Prostate cancer ([place]) 08/14/2020  . Chronic post-traumatic stress disorder 08/14/2020    History reviewed. No pertinent surgical history.     History reviewed. No pertinent family history.  Social History   Tobacco Use  . Smoking status: Unknown If Ever Smoked  . Smokeless tobacco: Never Used  Substance Use Topics  . Alcohol use: No  . Drug use: No    Home Medications Prior to Admission medications   Medication Sig Start Date End Date Taking? Authorizing Provider  albuterol (VENTOLIN HFA) 108 (90 Base) MCG/ACT inhaler Inhale 2 puffs into the lungs 4 (four) times daily as needed (For cough per [facility]).    [provider]  aspirin EC 81 MG tablet Take 81 mg by mouth daily. Swallow whole.    [provider]  Calcium Carb-Cholecalciferol (CALCIUM/VITAMIN D PO) Take 1 tablet by mouth daily.    [provider]  citalopram  (CELEXA) 40 MG tablet Take 20 mg by mouth daily.    [provider]  clopidogrel (PLAVIX) 75 MG tablet Take 1 tablet (75 mg total) by mouth daily. 08/19/20   Tevan Marian R, PA-C  cyclobenzaprine (FLEXERIL) 5 MG tablet Take 1 tablet (5 mg total) by mouth 2 (two) times daily as needed for muscle spasms. 04/13/17   Tegeler, Gwenyth Allegra, MD  cyclobenzaprine (FLEXERIL) 5 MG tablet Take 1 tablet (5 mg total) by mouth at bedtime as needed for muscle spasms. 04/23/17   Caccavale, Sophia, PA-C  divalproex (DEPAKOTE ER) 500 MG 24 hr tablet Take 1,000 mg by mouth daily.    [provider]  fluticasone (FLONASE) 50 MCG/ACT nasal spray Place 1 spray into both nostrils 2 (two) times daily as needed for allergies.    [provider]  gabapentin (NEURONTIN) 300 MG capsule Take 300-600 mg by mouth See admin instructions. Take one capsule by mouth every morning and take two capsules at bedtime per [facility]    [provider]  HYDROcodone-acetaminophen (NORCO/VICODIN) 5-325 MG tablet Take 1 tablet by mouth every 6 (six) hours as needed for moderate pain.    [provider]  ibuprofen (ADVIL,MOTRIN) 600 MG tablet Take 1 tablet (600 mg total) by mouth 2 (two) times daily with a meal. 04/23/17   Caccavale, Sophia, PA-C  lisinopril (ZESTRIL) 5 MG tablet Take 5 mg by mouth daily.    [provider]  loratadine (CLARITIN) 10 MG tablet Take 10 mg by mouth daily.    [provider]  Magnesium Oxide 420 MG TABS Take 1 tablet by mouth daily.    [provider]  meloxicam (MOBIC) 15 MG tablet Take 15 mg by mouth daily.    [provider]  Multiple Vitamin (MULTIVITAMIN WITH MINERALS) TABS tablet Take 1 tablet by mouth daily.    [provider]  rosuvastatin (CRESTOR) 20 MG tablet Take 1 tablet (20 mg total) by mouth daily. 08/20/20   Milan Clare R, PA-C  terazosin (HYTRIN) 2 MG capsule Take 2 mg by mouth at bedtime.    [provider]  traZODone (DESYREL) 50 MG tablet Take 25 mg by mouth at bedtime as needed for sleep.    [provider]    Allergies    Patient has no known allergies.  Review of Systems   Review of Systems  Constitutional: Negative for chills and fever.  Respiratory: Negative for shortness of breath.   Cardiovascular: Negative for chest pain.  Gastrointestinal: Negative for vomiting.  Neurological: Negative for dizziness, syncope, weakness and numbness.    Physical Exam Updated Vital Signs BP 130/72 (BP Location: Left Arm)   Pulse 62   Temp 99 F (37.2 C) (Oral)   Resp 16   Ht 6\' 3"  (1.905 m)   Wt 107.9 kg   SpO2 99%   BMI 29.73 kg/m   Physical Exam Vitals and nursing note reviewed.  Constitutional:      General: He is not in acute distress.    Appearance: He is well-developed. He is not toxic-appearing.  HENT:     Head: Normocephalic and atraumatic.  Eyes:     General:        Right eye: No discharge.        Left eye: No discharge.     Conjunctiva/sclera: Conjunctivae normal.  Cardiovascular:     Rate and Rhythm: Normal rate and regular rhythm.  Pulmonary:     Effort: Pulmonary effort is normal. No respiratory distress.     Breath sounds: Normal breath sounds. No wheezing, rhonchi or rales.  Abdominal:     General: There is no distension.     Palpations: Abdomen is soft.     Tenderness: There is no abdominal tenderness.  Musculoskeletal:     Cervical back: Neck supple.  Skin:    General: Skin is warm and dry.     Findings: No rash.  Neurological:     Mental Status: He is alert.     Comments: Clear speech.   Psychiatric:        Behavior: Behavior normal.     ED Results / Procedures / Treatments   Labs (all labs ordered are listed, but only abnormal results are displayed) Labs Reviewed - No data to display  EKG None  Radiology No results found.  Procedures Procedures   Medications Ordered in ED Medications - No data to  display  ED Course  I have reviewed the triage vital signs and the nursing notes.  Pertinent labs & imaging results that were available during my care of the patient were reviewed by me and considered in my medical decision making (see chart for details).    MDM Rules/Calculators/A&P                          Patient presents to the ED requesting medication refill.  No other complaints at this time.  Chart review & nursing note review for additional hx.  Will give hard copies  of prescriptions that were provided at discharge.   I discussed  treatment plan, need for follow-up, and return precautions with the patient. Provided opportunity for questions, patient confirmed understanding and is in agreement with plan.   Final Clinical Impression(s) / ED Diagnoses Final diagnoses:  Medication refill    Rx / DC Orders ED Discharge Orders         Ordered    rosuvastatin (CRESTOR) 20 MG tablet  Daily        08/20/20 0002    clopidogrel (PLAVIX) 75 MG tablet  Daily        08/20/20 [address], [place], PA-C 08/20/20 0050    Maudie Flakes, MD 08/20/20 [phone]

## 2020-08-20 NOTE — Discharge Instructions (Signed)
We have provided prescriptions for you plavix & your crestor. Please take these as prescribed.   We have prescribed you new medication(s) today. Discuss the medications prescribed today with your pharmacist as they can have adverse effects and interactions with your other medicines including over the counter and prescribed medications. Seek medical evaluation if you start to experience new or abnormal symptoms after taking one of these medicines, seek care immediately if you start to experience difficulty breathing, feeling of your throat closing, facial swelling, or rash as these could be indications of a more serious allergic reaction  Follow up with primary care as soon as possible.  Return to the ER for new or worsening symptoms or any other concerns.

## 2020-09-23 ENCOUNTER — Ambulatory Visit (INDEPENDENT_AMBULATORY_CARE_PROVIDER_SITE_OTHER): Payer: No Typology Code available for payment source | Admitting: Adult Health

## 2020-09-23 ENCOUNTER — Encounter: Payer: Self-pay | Admitting: Adult Health

## 2020-09-23 VITALS — BP 149/78 | HR 64 | Ht 75.0 in | Wt 231.0 lb

## 2020-09-23 DIAGNOSIS — E785 Hyperlipidemia, unspecified: Secondary | ICD-10-CM | POA: Diagnosis not present

## 2020-09-23 DIAGNOSIS — I10 Essential (primary) hypertension: Secondary | ICD-10-CM | POA: Diagnosis not present

## 2020-09-23 DIAGNOSIS — G459 Transient cerebral ischemic attack, unspecified: Secondary | ICD-10-CM

## 2020-09-23 NOTE — Progress Notes (Signed)
I agree with the above plan 

## 2020-09-23 NOTE — Progress Notes (Signed)
[facility] [address] [place]. [place] [address] [phone]       HOSPITAL FOLLOW UP NOTE  Mr. Jacob [name] Date of Birth:  12/08/1945 Medical Record Number:  [id number]   Reason for Referral:  hospital stroke follow up    SUBJECTIVE:   CHIEF COMPLAINT:  Chief Complaint  Patient presents with  . Follow-up    Tr alone Pt states everything is getting much better for him.     HPI:   Mr. Jacob [name] is a 75 yoVeteranmale with a PMHx of BPH,DM II,prostate cancer, PTSD, major depression, HTN, GERD, HLD,DDD, spinal stenosis,peripheral neuropathy, and likely dementia (patient on Aricept on [place] record) who presented on 08/14/2020 for episode of dizziness, difficulty walking and blurry vision which resolved after an hour.  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  He is advised to avoid low BP with BP goal 130-150.  Recommended DAPT for 3 weeks and Plavix alone as on aspirin PTA.  A1c 6.5.  LDL 98 on Zocor 40 mg daily therefore switch to Crestor 20 mg daily.  2D echo 60 to 65%.  Evaluated by therapies and discharged home in stable condition.    Stroke - posterior circulation TIA  Head CT  negative  CTA neck mild atherosclerotic disease within both carotid systems.  Vertebral arteries developmentally diminutive but patent within the neck.  Suspected moderate/severe stenosis at the origin of the right [place]. nonspecific 18mm cystic appearing cutaneous/subcutaneous lesion posterior left upper neck.  CTA head markedly irregular and stenotic appearance of the V4 vertebral arteries bilaterally and proximal basilar artery with possible partial occlusion of these vessels  MRI  no evidence of acute intracranial abnormality.  Mild cerebral white matter chronic small vessel ischemic disease progressed compared to imaging in 10/2010  2D Echo EF 60 to 65%  LDL 98  HgbA1c   6.5  Antithrombotic regimen: On aspirin PTA. Now on aspirin and Plavix for 3 weeks then Plavix alone  Therapy recommendations: none  Disposition:  home   Today, 09/23/2020, Jacob [name] is being seen for hospital follow-up unaccompanied.  Reports he has been doing well since discharge without any new or recurring stroke/TIA symptoms.  He has returned back to all prior activities without difficulty.  He continues to live independently maintaining all ADLs and IADLs independently except for driving.  He has remained on aspirin and Plavix despite 3-week recommendation but denies bleeding or bruising.  Remains on Crestor without associated side effects.  Blood pressure today 149/78.  He routinely follows with PCP at the [facility] which he has seen since discharge.  No concerns at this time.    ROS:   14 system review of systems performed and negative with exception of no complaints  PMH:  Past Medical History:  Diagnosis Date  . Diabetes mellitus without complication ([place])   . Hypertension     PSH: History reviewed. No pertinent surgical history.  Social History:  Social History   Socioeconomic History  . Marital status: Single    Spouse name: Not on file  . Number of children: Not on file  . Years of education: Not on file  . Highest education level: Not on file  Occupational History  . Not on file  Tobacco Use  . Smoking status: Unknown If Ever Smoked  . Smokeless tobacco: Never Used  Substance and Sexual Activity  . Alcohol use: No  . Drug use: No  . Sexual activity:  Not on file  Other Topics Concern  . Not on file  Social History Narrative  . Not on file   Social Determinants of Health   Financial Resource Strain: Not on file  Food Insecurity: Not on file  Transportation Needs: Not on file  Physical Activity: Not on file  Stress: Not on file  Social Connections: Not on file  Intimate Partner Violence: Not on file    Family History: History reviewed. No pertinent  family history.  Medications:   Current Outpatient Medications on File Prior to Visit  Medication Sig Dispense Refill  . albuterol (VENTOLIN HFA) 108 (90 Base) MCG/ACT inhaler Inhale 2 puffs into the lungs 4 (four) times daily as needed (For cough per [facility]).    . Calcium Carb-Cholecalciferol (CALCIUM/VITAMIN D PO) Take 1 tablet by mouth daily.    . citalopram (CELEXA) 40 MG tablet Take 20 mg by mouth daily.    . clopidogrel (PLAVIX) 75 MG tablet Take 1 tablet (75 mg total) by mouth daily. 90 tablet 0  . cyclobenzaprine (FLEXERIL) 5 MG tablet Take 1 tablet (5 mg total) by mouth 2 (two) times daily as needed for muscle spasms. 14 tablet 0  . cyclobenzaprine (FLEXERIL) 5 MG tablet Take 1 tablet (5 mg total) by mouth at bedtime as needed for muscle spasms. 5 tablet 0  . divalproex (DEPAKOTE ER) 500 MG 24 hr tablet Take 1,000 mg by mouth daily.    . fluticasone (FLONASE) 50 MCG/ACT nasal spray Place 1 spray into both nostrils 2 (two) times daily as needed for allergies.    Marland Kitchen gabapentin (NEURONTIN) 300 MG capsule Take 300-600 mg by mouth See admin instructions. Take one capsule by mouth every morning and take two capsules at bedtime per [facility]    . HYDROcodone-acetaminophen (NORCO/VICODIN) 5-325 MG tablet Take 1 tablet by mouth every 6 (six) hours as needed for moderate pain.    Marland Kitchen ibuprofen (ADVIL,MOTRIN) 600 MG tablet Take 1 tablet (600 mg total) by mouth 2 (two) times daily with a meal. 30 tablet 0  . lisinopril (ZESTRIL) 5 MG tablet Take 5 mg by mouth daily.    Marland Kitchen loratadine (CLARITIN) 10 MG tablet Take 10 mg by mouth daily.    . Magnesium Oxide 420 MG TABS Take 1 tablet by mouth daily.    . meloxicam (MOBIC) 15 MG tablet Take 15 mg by mouth daily.    . Multiple Vitamin (MULTIVITAMIN WITH MINERALS) TABS tablet Take 1 tablet by mouth daily.    . rosuvastatin (CRESTOR) 20 MG tablet Take 1 tablet (20 mg total) by mouth daily. 90 tablet 0  . terazosin (HYTRIN) 2 MG capsule Take 2 mg by mouth at bedtime.     . traZODone (DESYREL) 50 MG tablet Take 25 mg by mouth at bedtime as needed for sleep.     No current facility-administered medications on file prior to visit.    Allergies:  No Known Allergies    OBJECTIVE:  Physical Exam  Vitals:   09/23/20 1302  BP: (!) 149/78  Pulse: 64  Weight: 231 lb (104.8 kg)  Height: 6\' 3"  (1.905 m)   Body mass index is 28.87 kg/m. No exam data present  Post stroke PHQ 2/9 Depression screen PHQ 2/9 09/23/2020  Decreased Interest 0  Down, Depressed, Hopeless 0  PHQ - 2 Score 0     General: well developed, well nourished,  very pleasant elderly African-American male, seated, in no evident distress Head: head normocephalic and atraumatic.   Neck:  supple with no carotid or supraclavicular bruits Cardiovascular: regular rate and rhythm, no murmurs Musculoskeletal: no deformity Skin:  no rash/petichiae Vascular:  Normal pulses all extremities   Neurologic Exam Mental Status: Awake and fully alert.   Fluent speech and language.  Oriented to place and time. Recent and remote memory intact. Attention span, concentration and fund of knowledge appropriate. Mood and affect appropriate.  Cranial Nerves: Fundoscopic exam reveals sharp disc margins. Pupils equal, briskly reactive to light. Extraocular movements full without nystagmus. Visual fields full to confrontation. Hearing intact. Facial sensation intact. Face, tongue, palate moves normally and symmetrically.  Motor: Normal bulk and tone. Normal strength in all tested extremity muscles Sensory.: intact to touch , pinprick , position and vibratory sensation.  Coordination: Rapid alternating movements normal in all extremities. Finger-to-nose and heel-to-shin performed accurately bilaterally. Gait and Station: Arises from chair without difficulty. Stance is normal. Gait demonstrates normal stride length and balance without use of assistive device. Tandem walk and heel toe without difficulty.  Reflexes: 1+  and symmetric. Toes downgoing.     NIHSS  0 Modified Rankin  0      ASSESSMENT: Jacob [name] is a 75 y.o. 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 Vascular risk factors include HTN, HLD, DM, prostate cancer and dementia.      PLAN:  1. TIA : Continue clopidogrel 75 mg daily  and Crestor for secondary stroke prevention.  Advised refills will be obtained by his PCP at the [place].  Advised to discontinue aspirin at this time as 3 weeks DAPT completed.  Discussed secondary stroke prevention measures and importance of close PCP follow up for aggressive stroke risk factor management  2. HTN: BP goal 130-150.  Stable on lisinopril per PCP 3. HLD: LDL goal <70. Recent LDL 98 on Zocor therefore switch to Crestor 20 mg daily during recent admission.  Request follow-up with PCP in the next 1 to 2 months for repeat lipid panel 4. DMII: A1c goal<7.0. Recent A1c 6.5.  Well-managed on nonpharmacological management    Follow up in 6 months or call earlier if needed   CC:  [facility] provider: Dr. Leonie Man PCP: [facility]    I spent 51 minutes of face-to-face and non-face-to-face time with patient.  This included previsit chart review including extensive review of recent hospitalization pertinent progress notes, lab work and imaging, electronic health record documentation, and discussion and education with patient regarding recent TIA and likely etiology, secondary stroke prevention measures and importance of managing stroke risk factors as well as compliance with all prescribed medications and answered all other questions to patient satisfaction   Frann Rider, AGNP-BC  [facility] [address] [place], [place] [address]  Phone [phone] Fax [phone] Note: This document was prepared with digital dictation and possible smart phrase technology. Any  transcriptional errors that result from this process are unintentional.

## 2020-09-23 NOTE — Patient Instructions (Addendum)
Continue clopidogrel 75 mg daily  and Crestor for secondary stroke prevention - your primary doctor at the [place] will continue to fill  Stop aspirin at this time as you have completed recommended 3-week duration  Continue to follow up with PCP regarding cholesterol and blood pressure management  Maintain strict control of hypertension with blood pressure goal between 130-150 and cholesterol with LDL cholesterol (bad cholesterol) goal below 70 mg/dL.       Followup in the future with me in 6 months or call earlier if needed       Thank you for coming to see [place] at [facility]. I hope we have been able to provide you high quality care today.  You may receive a patient satisfaction survey over the next few weeks. We would appreciate your feedback and comments so that we may continue to improve ourselves and the health of our patients.   Transient Ischemic Attack A transient ischemic attack (TIA) causes stroke-like symptoms that go away quickly. Having a TIA means that a person is at higher risk for a stroke. A TIA happens when blood supply to the brain is blocked temporarily. A TIA is a medical emergency. What are the causes? This condition is caused by a temporary blockage in an artery in the head or neck. This means the brain does not get the blood supply it needs. There is no permanent brain damage with a TIA. A blockage can be caused by:  Fatty buildup in an artery in the head or neck (atherosclerosis).  A blood clot.  An artery tear (dissection).  Inflammation of an artery (vasculitis). Sometimes the cause is not known. What increases the risk? Certain factors may make you more likely to develop this condition. Some of these are things that you can change, such as:  Obesity.  Using products that contain nicotine or tobacco.  Taking oral birth control, especially if you also use tobacco.  Not being active.  Heavy alcohol use.  Drug use, especially  cocaine and methamphetamine. Medical conditions that may increase your risk include:  High blood pressure (hypertension).  High cholesterol.  Diabetes.  Heart disease (coronary artery disease).  An irregular heartbeat, also called atrial fibrillation (AFib).  Sickle cell disease.  Sleep problems (sleep apnea).  Chronic inflammatory diseases, such as rheumatoid arthritis or lupus.  Blood clotting disorders (hypercoagulable state). Other risk factors include:  Being over the age of 15.  Being male.  Family history of stroke.  Previous history of blood clots, stroke, TIA, or heart attack.  Having a history of preeclampsia.  Migraine headache. What are the signs or symptoms? Symptoms of a TIA are the same as those of a stroke. The symptoms develop suddenly, and then go away quickly. They may include:  Weakness or numbness in your face, arm, or leg, especially on one side of your body.  Trouble walking or moving your arms or legs.  Trouble speaking, understanding speech, or both (aphasia).  Vision changes, such as double vision, blurred vision, or loss of vision.  Dizziness.  Confusion.  Loss of balance or coordination.  Nausea and vomiting.  Severe headache. If possible, note what time your symptoms started. Tell your health care provider.   How is this diagnosed? This condition may be diagnosed based on:  Your symptoms and medical history.  A physical exam.  Imaging tests, usually a CT scan or MRI of the brain.  Blood tests. You may also have other tests, including:  Electrocardiogram (ECG).  Echocardiogram.  Carotid ultrasound.  A scan of blood circulation in the brain (CT angiogram or MR angiogram).  Continuous heart monitoring. How is this treated? The goal of treatment is to reduce the risk for a stroke. Stroke prevention therapies may include:  Changes to diet and lifestyle, such as being physically active and stopping  smoking.  Medicines to thin the blood (antiplatelets or anticoagulants).  Blood pressure medicines.  Medicines to reduce cholesterol.  Treating other health conditions, such as diabetes or AFib. If testing shows a narrowing in the arteries to your brain, your health care provider may recommend a procedure, such as:  Carotid endarterectomy. This is done to remove the blockage from your artery.  Carotid angioplasty and stenting. This uses a tube (stent) to open or widen an artery in the neck. The stent helps keep the artery open by supporting the artery walls. Follow these instructions at home: Medicines  Take over-the-counter and prescription medicines only as told by your health care provider.  If you were told to take a medicine to thin your blood, such as aspirin or an anticoagulant, take it exactly as told by your health care provider. ? Taking too much blood-thinning medicine can cause bleeding. Taking too little will not protect you against a stroke and other problems. Eating and drinking  Eat 5 or more servings of fruits and vegetables each day.  Follow guidelines from your health care provider about your diet. You may need to follow a certain diet to help manage risk factors for stroke. This may include: ? Eating a low-fat, low-salt diet. ? Choosing high-fiber foods. ? Limiting carbohydrates and sugar.  If you drink alcohol: ? Limit how much you have to:  0-1 drink a day for women who are not pregnant.  0-2 drinks a day for men. ? Know how much alcohol is in a drink. In the [place] one drink equals one 12 oz bottle of beer (371mL), one 5 oz glass of wine (133mL), or one 1 oz glass of hard liquor (81mL).   General instructions  Maintain a healthy weight.  Try to get at least 30 minutes of exercise on most days.  Get treatment if you have sleep apnea.  Do not use any products that contain nicotine or tobacco. These products include cigarettes, chewing tobacco, and  vaping devices, such as e-cigarettes. If you need help quitting, ask your health care provider.  Do not use drugs.  Keep all follow-up visits. This is important. Where to find more information  American Stroke Association: www.stroke.org Get help right away if:  You have chest pain or an irregular heartbeat.  You have any symptoms of a stroke. "BE FAST" is an easy way to remember the main warning signs of a stroke. ? B - Balance. Signs are dizziness, sudden trouble walking, or loss of balance. ? E - Eyes. Signs are trouble seeing or a sudden change in vision. ? F - Face. Signs are sudden weakness or numbness of the face, or the face or eyelid drooping on one side. ? A - Arms. Signs are weakness or numbness in an arm. This happens suddenly and usually on one side of the body. ? S - Speech. Signs are sudden trouble speaking, slurred speech, or trouble understanding what people say. ? T - Time. Time to call emergency services. Write down what time symptoms started.  You have other signs of a stroke, such as: ? A sudden, severe headache with no known cause. ? Nausea or  vomiting. ? Seizure. These symptoms may represent a serious problem that is an emergency. Do not wait to see if the symptoms will go away. Get medical help right away. Call your local emergency services (911 in the [place] Do not drive yourself to the hospital. Summary  A transient ischemic attack (TIA) happens when an artery in the head or neck is blocked. The blockage clears before there is any permanent brain damage. A TIA is a medical emergency.  Symptoms of a TIA are the same as those of a stroke. The symptoms develop suddenly, and then go away quickly.  Having a TIA means that you are at higher risk for a stroke.  The goal of treatment is to reduce your risk for a stroke. Treatment may include medicines to thin the blood and changes to diet and lifestyle. This information is not intended to replace advice given to  you by your health care provider. Make sure you discuss any questions you have with your health care provider. Document Revised: 11/12/2019 Document Reviewed: 11/12/2019 Elsevier Patient Education  [place].

## 2021-03-30 ENCOUNTER — Ambulatory Visit: Payer: No Typology Code available for payment source | Admitting: Adult Health

## 2021-03-30 ENCOUNTER — Other Ambulatory Visit: Payer: Self-pay

## 2021-03-30 ENCOUNTER — Ambulatory Visit (INDEPENDENT_AMBULATORY_CARE_PROVIDER_SITE_OTHER): Payer: No Typology Code available for payment source | Admitting: Adult Health

## 2021-03-30 ENCOUNTER — Encounter: Payer: Self-pay | Admitting: Adult Health

## 2021-03-30 VITALS — BP 148/78 | HR 58 | Ht 75.0 in | Wt 223.0 lb

## 2021-03-30 DIAGNOSIS — G459 Transient cerebral ischemic attack, unspecified: Secondary | ICD-10-CM | POA: Diagnosis not present

## 2021-03-30 DIAGNOSIS — E785 Hyperlipidemia, unspecified: Secondary | ICD-10-CM | POA: Diagnosis not present

## 2021-03-30 DIAGNOSIS — I10 Essential (primary) hypertension: Secondary | ICD-10-CM

## 2021-03-30 NOTE — Progress Notes (Signed)
[facility] [address] [place]. [place] [address] [phone]       STROKE FOLLOW UP NOTE  Jacob [name] Date of Birth:  05-11-1945 Medical Record Number:  [id number]   Reason for Referral: stroke follow up    SUBJECTIVE:   CHIEF COMPLAINT:  Chief Complaint  Patient presents with   Follow-up    RM 3 alone  Pt is well and stable, has been working on proper nutrient intake.  No new concerns      HPI:   Update 03/30/2021 JM: Returns for 49-month TIA follow-up unaccompanied  Overall stable since prior visit -denies new or reoccurring stroke/TIA symptoms Continues to maintain ADLs and IADLs independently although he does not drive  Compliant on Plavix and Crestor -denies side effects Blood pressure today 148/78 - monitors at home and has been stable  Routinely followed by [facility] for routine monitoring and routine lab work - reports recently completed which was satisfactory    History provided for reference purposes only Initial visit 09/23/2020 JM: Jacob [name] is being seen for hospital follow-up unaccompanied.  Reports he has been doing well since discharge without any new or recurring stroke/TIA symptoms.  He has returned back to all prior activities without difficulty.  He continues to live independently maintaining all ADLs and IADLs independently except for driving.  He has remained on aspirin and Plavix despite 3-week recommendation but denies bleeding or bruising.  Remains on Crestor without associated side effects.  Blood pressure today 149/78.  He routinely follows with PCP at the [facility] which he has seen since discharge.  No concerns at this time.  Stroke admission 08/14/2020 Jacob [name] is a 75 yo [place] male with a PMHx of BPH, DM II, prostate cancer, PTSD, major depression, HTN, GERD, HLD, DDD, spinal stenosis, peripheral neuropathy, and likely dementia (patient on Aricept on [place] record) who presented on 08/14/2020 for episode of dizziness,  difficulty walking and blurry vision which resolved after an hour.  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  He is advised to avoid low BP with BP goal 130-150.  Recommended DAPT for 3 weeks and Plavix alone as on aspirin PTA.  A1c 6.5.  LDL 98 on Zocor 40 mg daily therefore switch to Crestor 20 mg daily.  2D echo 60 to 65%.  Evaluated by therapies and discharged home in stable condition.    Stroke - posterior circulation TIA Head CT  negative CTA neck mild atherosclerotic disease within both carotid systems.  Vertebral arteries developmentally diminutive but patent within the neck.  Suspected moderate/severe stenosis at the origin of the right [place]. nonspecific 28mm cystic appearing cutaneous/subcutaneous lesion posterior left upper neck. CTA head markedly irregular and stenotic appearance of the V4 vertebral arteries bilaterally and proximal basilar artery with possible partial occlusion of these vessels MRI  no evidence of acute intracranial abnormality.  Mild cerebral white matter chronic small vessel ischemic disease progressed compared to imaging in 10/2010 2D Echo EF 60 to 65% LDL 98 HgbA1c  6.5 Antithrombotic regimen: On aspirin PTA. Now on aspirin and Plavix for 3 weeks then Plavix alone Therapy recommendations: none Disposition:  home      ROS:   14 system review of systems performed and negative with exception of no complaints  PMH:  Past Medical History:  Diagnosis Date   Diabetes mellitus without complication ([place])    Hypertension     PSH: History reviewed. No pertinent surgical  history.  Social History:  Social History   Socioeconomic History   Marital status: Single    Spouse name: Not on file   Number of children: Not on file   Years of education: Not on file   Highest education level: Not on file  Occupational History   Not on file  Tobacco Use   Smoking status: Unknown    Smokeless tobacco: Never  Substance and Sexual Activity   Alcohol use: No   Drug use: No   Sexual activity: Not on file  Other Topics Concern   Not on file  Social History Narrative   Not on file   Social Determinants of Health   Financial Resource Strain: Not on file  Food Insecurity: Not on file  Transportation Needs: Not on file  Physical Activity: Not on file  Stress: Not on file  Social Connections: Not on file  Intimate Partner Violence: Not on file    Family History: History reviewed. No pertinent family history.  Medications:   Current Outpatient Medications on File Prior to Visit  Medication Sig Dispense Refill   albuterol (VENTOLIN HFA) 108 (90 Base) MCG/ACT inhaler Inhale 2 puffs into the lungs 4 (four) times daily as needed (For cough per [facility]).     Calcium Carb-Cholecalciferol (CALCIUM/VITAMIN D PO) Take 1 tablet by mouth daily.     citalopram (CELEXA) 40 MG tablet Take 20 mg by mouth daily.     clopidogrel (PLAVIX) 75 MG tablet Take 1 tablet (75 mg total) by mouth daily. 90 tablet 0   cyclobenzaprine (FLEXERIL) 5 MG tablet Take 1 tablet (5 mg total) by mouth 2 (two) times daily as needed for muscle spasms. 14 tablet 0   cyclobenzaprine (FLEXERIL) 5 MG tablet Take 1 tablet (5 mg total) by mouth at bedtime as needed for muscle spasms. 5 tablet 0   divalproex (DEPAKOTE ER) 500 MG 24 hr tablet Take 1,000 mg by mouth daily.     fluticasone (FLONASE) 50 MCG/ACT nasal spray Place 1 spray into both nostrils 2 (two) times daily as needed for allergies.     gabapentin (NEURONTIN) 300 MG capsule Take 300-600 mg by mouth See admin instructions. Take one capsule by mouth every morning and take two capsules at bedtime per [facility]     HYDROcodone-acetaminophen (NORCO/VICODIN) 5-325 MG tablet Take 1 tablet by mouth every 6 (six) hours as needed for moderate pain.     ibuprofen (ADVIL,MOTRIN) 600 MG tablet Take 1 tablet (600 mg total) by mouth 2 (two) times daily with a meal. 30 tablet 0    lisinopril (ZESTRIL) 5 MG tablet Take 5 mg by mouth daily.     loratadine (CLARITIN) 10 MG tablet Take 10 mg by mouth daily.     Magnesium Oxide 420 MG TABS Take 1 tablet by mouth daily.     meloxicam (MOBIC) 15 MG tablet Take 15 mg by mouth daily.     Multiple Vitamin (MULTIVITAMIN WITH MINERALS) TABS tablet Take 1 tablet by mouth daily.     rosuvastatin (CRESTOR) 20 MG tablet Take 1 tablet (20 mg total) by mouth daily. 90 tablet 0   terazosin (HYTRIN) 2 MG capsule Take 2 mg by mouth at bedtime.     traZODone (DESYREL) 50 MG tablet Take 25 mg by mouth at bedtime as needed for sleep.     No current facility-administered medications on file prior to visit.    Allergies:  No Known Allergies    OBJECTIVE:  Physical Exam  Vitals:  03/30/21 1325  BP: (!) 179/77  Pulse: (!) 58  Weight: 223 lb (101.2 kg)  Height: 6\' 3"  (1.905 m)    Body mass index is 27.87 kg/m. No results found.  General: well developed, well nourished,  very pleasant elderly African-American male, seated, in no evident distress Head: head normocephalic and atraumatic.   Neck: supple with no carotid or supraclavicular bruits Cardiovascular: regular rate and rhythm, no murmurs Musculoskeletal: no deformity Skin:  no rash/petichiae Vascular:  Normal pulses all extremities   Neurologic Exam Mental Status: Awake and fully alert.   Fluent speech and language.  Oriented to place and time. Recent and remote memory intact. Attention span, concentration and fund of knowledge appropriate. Mood and affect appropriate.  Cranial Nerves: Pupils equal, briskly reactive to light. Extraocular movements full without nystagmus. Visual fields full to confrontation. Hearing intact. Facial sensation intact. Face, tongue, palate moves normally and symmetrically.  Motor: Normal bulk and tone. Normal strength in all tested extremity muscles Sensory.: intact to touch , pinprick , position and vibratory sensation.  Coordination:  Rapid alternating movements normal in all extremities. Finger-to-nose and heel-to-shin performed accurately bilaterally. Gait and Station: Arises from chair without difficulty. Stance is normal. Gait demonstrates normal stride length and balance without use of assistive device. Tandem walk and heel toe without difficulty.  Reflexes: 1+ and symmetric. Toes downgoing.          ASSESSMENT: Jacob [name] is a 75 y.o. 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 Vascular risk factors include HTN, HLD, DM, prostate cancer and dementia.      PLAN:  TIA : Continue clopidogrel 75 mg daily  and Crestor for secondary stroke prevention. Discussed secondary stroke prevention measures and importance of close PCP follow up for aggressive stroke risk factor management. No medications managed/prescribed through this office HTN: BP goal 130-150.  Stable on lisinopril per PCP HLD: LDL goal <70.  Continue Crestor 20 mg daily managed by PCP/[facility].  DMII: A1c goal<7.0.  Prior A1c 6.5 (08/2020) on nonpharmacological management managed by PCP/[facility]    Overall stable from stroke standpoint. Request consolidating care with PCP and follow up here as needed   CC:  PCP: [facility] spent 24 minutes of face-to-face and non-face-to-face time with patient.  This included previsit chart review, lab review, study review, electronic health record documentation, patient education and discussion regarding prior TIA, secondary stroke prevention measures and aggressive stroke risk factor management and answered all other questions to patient satisfaction   Frann Rider, [facility]  [facility] [address] [place], [place] [address]  Phone [phone] Fax [phone] Note: This document was prepared with digital dictation and possible smart phrase technology. Any  transcriptional errors that result from this process are unintentional.

## 2021-03-30 NOTE — Patient Instructions (Signed)
Continue clopidogrel 75 mg daily  and Crestor  for secondary stroke prevention  Continue to follow up with PCP regarding cholesterol and blood pressure management  Maintain strict control of hypertension with blood pressure goal below 130/90 and cholesterol with LDL cholesterol (bad cholesterol) goal below 70 mg/dL.         Thank you for coming to see [place] at [facility]. I hope we have been able to provide you high quality care today.  You may receive a patient satisfaction survey over the next few weeks. We would appreciate your feedback and comments so that we may continue to improve ourselves and the health of our patients.

## 2023-03-12 IMAGING — MR MR HEAD W/O CM
6 of 12 series · 18 of 48 positions shown · non-contrast
Comparison: Noncontrast head CT and CT angiogram head/neck
performed earlier today 08/14/2020. Brain MRI 10/13/2010.

CLINICAL DATA: Transient ischemic attack (TIA). Additional history
provided: Dizziness, blurred vision, off balance.

EXAM:
MRI HEAD WITHOUT CONTRAST
MRA HEAD WITHOUT CONTRAST
TECHNIQUE: Multiplanar, multiecho pulse sequences of the brain and surrounding
structures were obtained without intravenous contrast. Angiographic
images of the head were obtained using MRA technique without
contrast.

[Series 5: DWI · coronal · 4.0mm · 0.94mm/px · 6 of 74 slices shown]
[im 1/74]
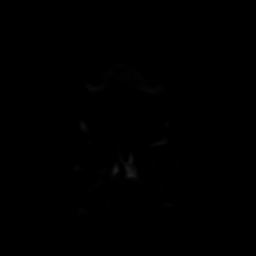
[im 15/74]
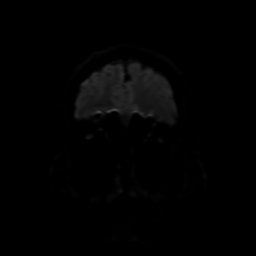
[im 30/74]
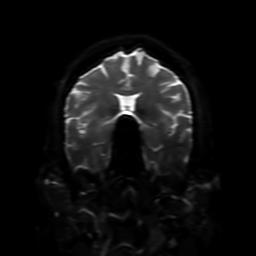
[im 44/74]
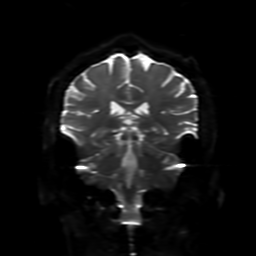
[im 59/74]
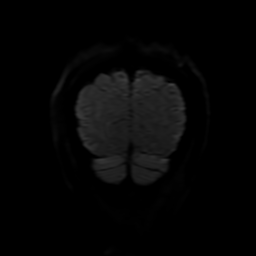
[im 74/74]
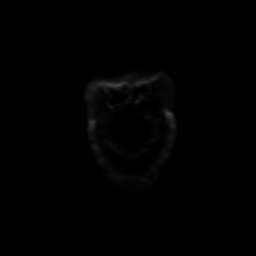

[Series 6: FLAIR · sagittal · 5.0mm · 0.25mm/px · 2 of 23 slices shown (1 of 2)]
[im 1/23]
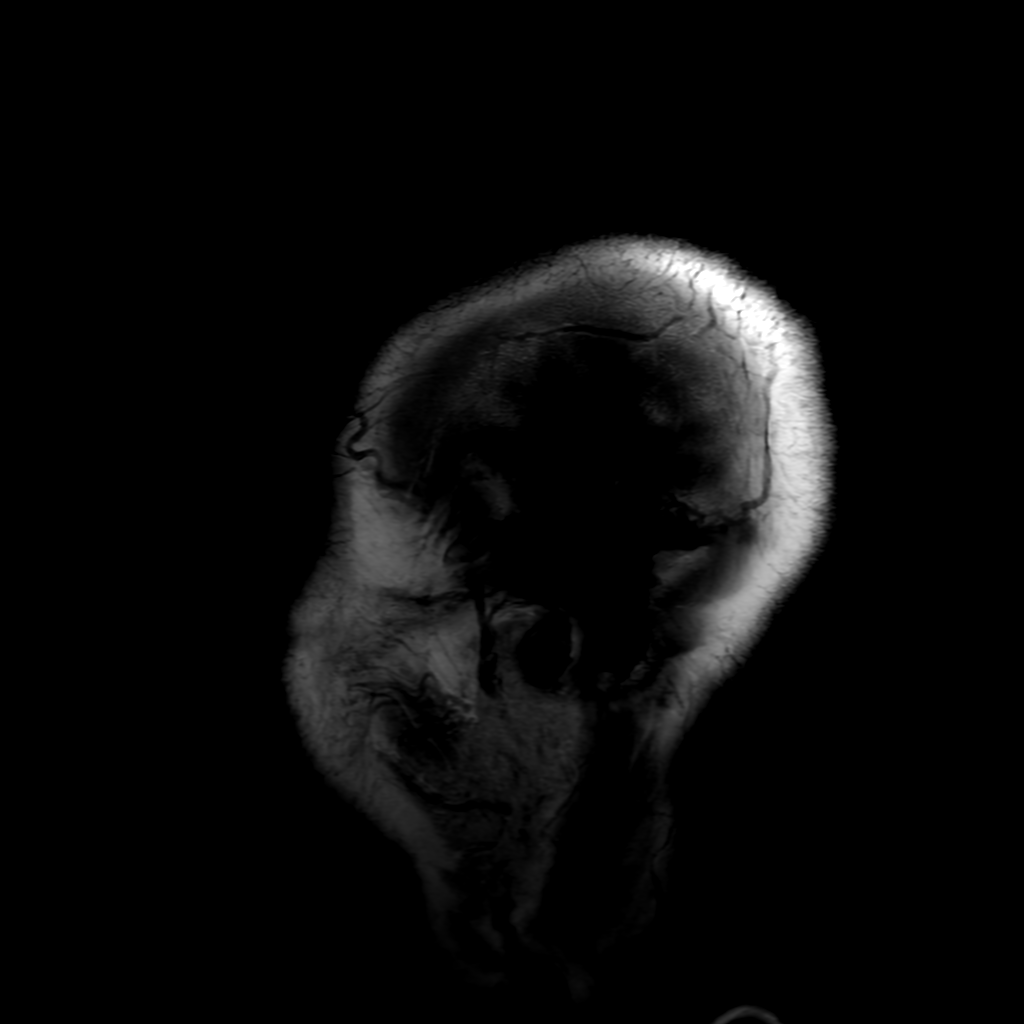
[im 23/23]
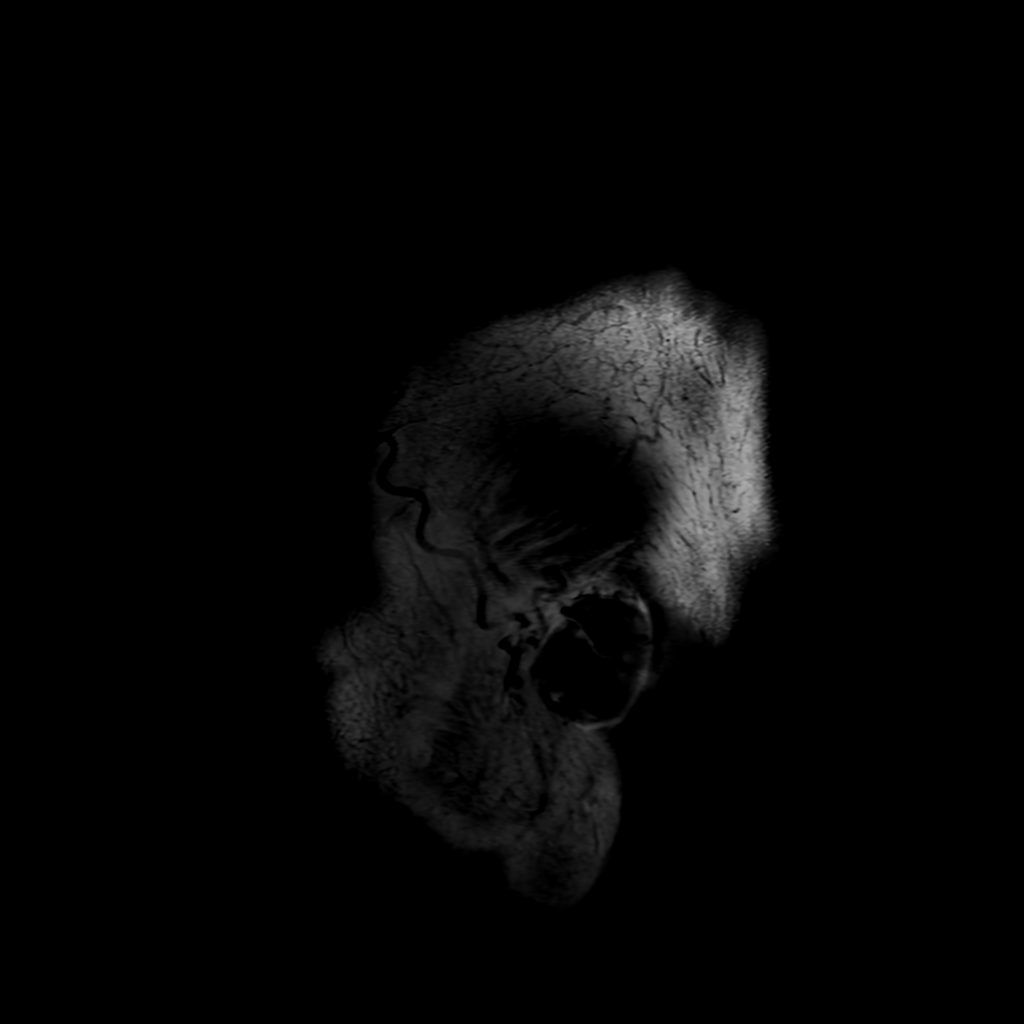

[Series 7: T2 · axial · 5.0mm · 0.23mm/px · 1 of 26 slices shown]
[im 1/26]
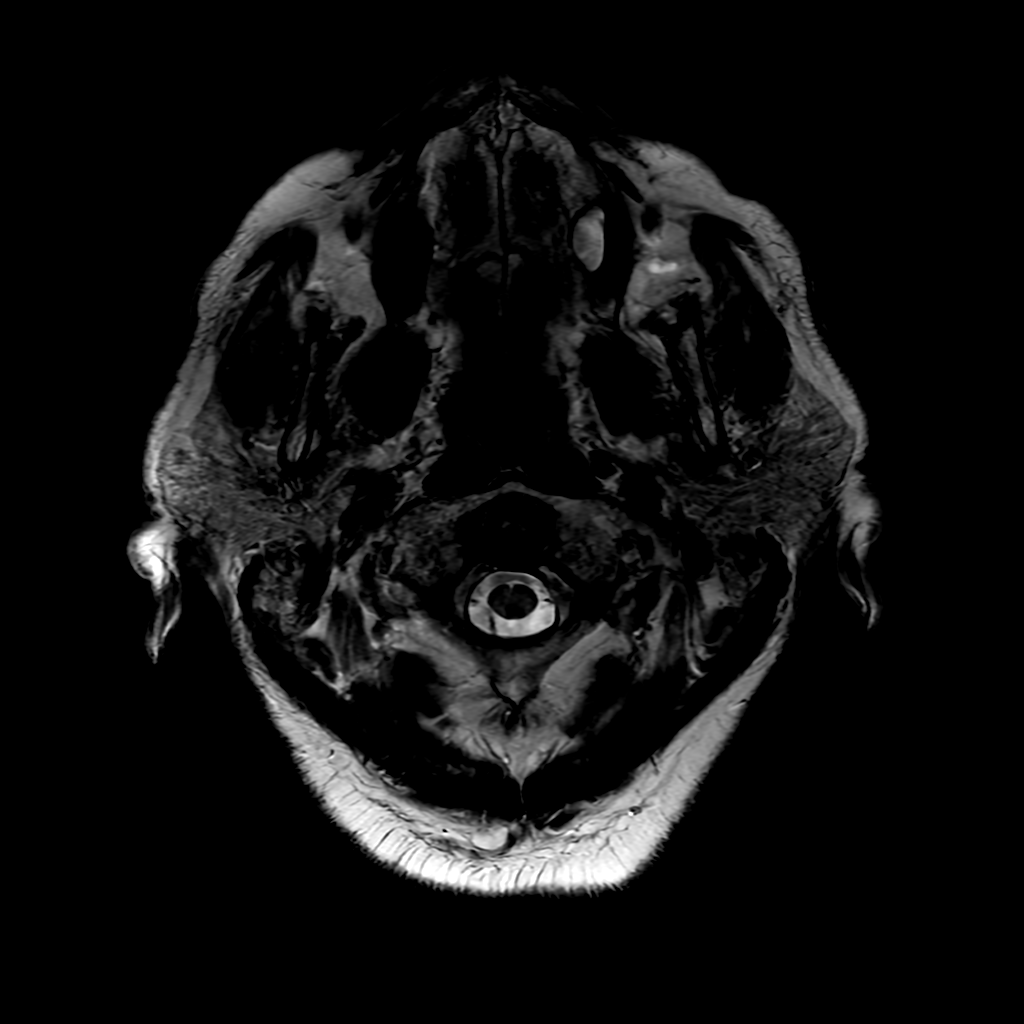

[Series 8: FLAIR · axial · 3.0mm · 0.45mm/px · z∈[-89,+32]mm · 2 of 23 slices shown (2 of 2)]
[im 1/23]
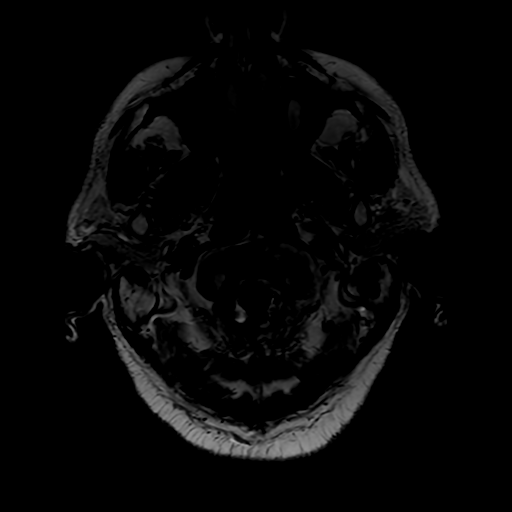
[im 23/23]
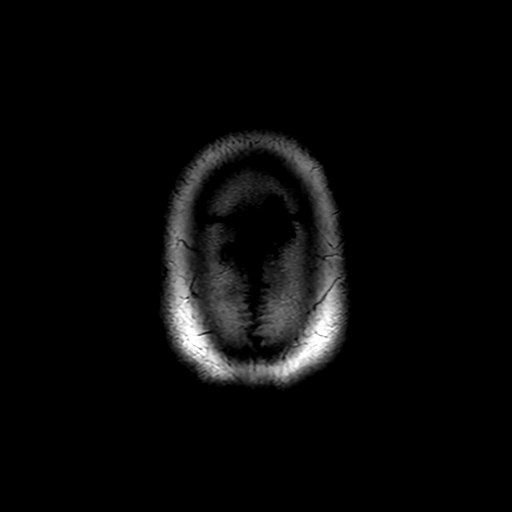

[Series 350: ADC · axial · 3.0mm · 0.94mm/px · z∈[-102,+31]mm · 4 of 50 slices shown (1 of 2)]
[im 1/50]
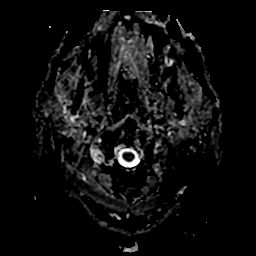
[im 17/50]
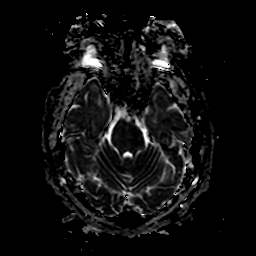
[im 33/50]
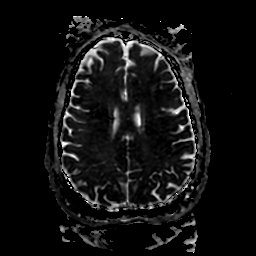
[im 50/50]
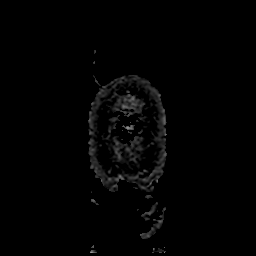

[Series 550: ADC · coronal · 4.0mm · 0.94mm/px · 3 of 37 slices shown (2 of 2)]
[im 1/37]
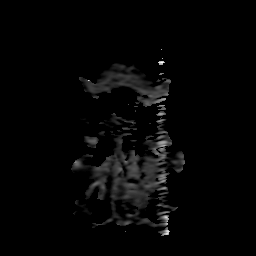
[im 19/37]
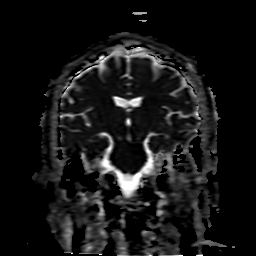
[im 37/37]
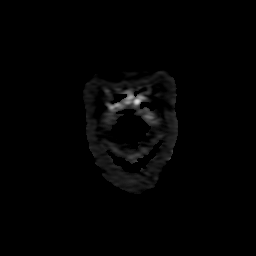

[18 of 48 positions shown; findings below may reference images not displayed]

FINDINGS: MRI HEAD FINDINGS

Brain:

Cerebral volume is normal for age.

Mild multifocal T2/FLAIR hyperintensity within the cerebral white
matter is nonspecific, but compatible with chronic small vessel
ischemic disease.

There is no acute infarct.

No evidence of intracranial mass.

No chronic intracranial blood products.

No extra-axial fluid collection.

No midline shift.

Vascular: Reported below.

Skull and upper cervical spine: No focal marrow lesion.

Sinuses/Orbits: Visualized orbits show no acute finding. Trace
bilateral ethmoid sinus mucosal thickening. Small bilateral
maxillary sinus mucous retention cysts.

Other: Redemonstrated nonspecific 2.2 cm cystic appearing
cutaneous/subcutaneous lesion within the posterior left upper neck.

MRA HEAD FINDINGS

The intracranial internal carotid arteries are patent. The M1 middle
cerebral arteries are patent. No M2 proximal branch occlusion or
high-grade proximal stenosis is identified. The anterior cerebral
arteries are patent. Hypoplastic right A1 segment.

There is no appreciable flow related signal within the intracranial
vertebral arteries. The proximal basilar artery is also poorly
delineated. Flow related signal is seen within the PICAs
bilaterally. Flow related signal is appreciated within the mid to
distal basilar artery (the basilar artery is developmentally
diminutive). Large bilateral posterior communicating arteries. The
posterior cerebral arteries are patent.

No intracranial aneurysm is identified.
IMPRESSION: MRI brain:

1. No evidence of acute intracranial abnormality.
2. Mild cerebral white matter chronic small vessel ischemic disease,
progressed as compared to the brain MRI of 10/13/2010.

MRA head:

1. No appreciable flow-related signal within the intracranial
vertebral arteries.
2. Additionally, the proximal basilar artery is poorly delineated.
3. Flow related signal is seen within the proximal PICAs
bilaterally.
4. Flow related signal is present within the mid to distal basilar
artery (the basilar artery is developmentally diminutive).
